# Patient Record
Sex: Female | Born: 1955 | Race: Black or African American | Hispanic: No | Marital: Married | Smoking: Current some day smoker
Health system: Southern US, Community
[De-identification: ages and names within clinical notes are randomized; demographics above are authoritative.]

## PROBLEM LIST (undated history)

## (undated) DIAGNOSIS — J449 Chronic obstructive pulmonary disease, unspecified: Secondary | ICD-10-CM

## (undated) DIAGNOSIS — E119 Type 2 diabetes mellitus without complications: Secondary | ICD-10-CM

## (undated) HISTORY — PX: TUBAL LIGATION: SHX77

## (undated) HISTORY — PX: KNEE SURGERY: SHX244

---

## 1999-10-14 ENCOUNTER — Other Ambulatory Visit: Admission: RE | Admit: 1999-10-14 | Discharge: 1999-10-14 | Payer: Self-pay | Admitting: Radiology

## 1999-11-08 ENCOUNTER — Other Ambulatory Visit: Admission: RE | Admit: 1999-11-08 | Discharge: 1999-11-08 | Payer: Self-pay | Admitting: Radiology

## 1999-11-08 ENCOUNTER — Encounter (INDEPENDENT_AMBULATORY_CARE_PROVIDER_SITE_OTHER): Payer: Self-pay

## 1999-11-11 ENCOUNTER — Other Ambulatory Visit: Admission: RE | Admit: 1999-11-11 | Discharge: 1999-11-11 | Payer: Self-pay | Admitting: Family Medicine

## 2001-10-08 ENCOUNTER — Other Ambulatory Visit: Admission: RE | Admit: 2001-10-08 | Discharge: 2001-10-20 | Payer: Self-pay | Admitting: Obstetrics and Gynecology

## 2001-10-20 ENCOUNTER — Emergency Department (HOSPITAL_COMMUNITY): Admission: EM | Admit: 2001-10-20 | Discharge: 2001-10-20 | Payer: Self-pay

## 2001-10-20 ENCOUNTER — Encounter: Payer: Self-pay | Admitting: Emergency Medicine

## 2003-03-17 ENCOUNTER — Ambulatory Visit (HOSPITAL_COMMUNITY): Admission: RE | Admit: 2003-03-17 | Discharge: 2003-03-17 | Payer: Self-pay | Admitting: Family Medicine

## 2003-06-02 ENCOUNTER — Ambulatory Visit (HOSPITAL_BASED_OUTPATIENT_CLINIC_OR_DEPARTMENT_OTHER): Admission: RE | Admit: 2003-06-02 | Discharge: 2003-06-02 | Payer: Self-pay | Admitting: Orthopedic Surgery

## 2004-07-24 ENCOUNTER — Encounter: Admission: RE | Admit: 2004-07-24 | Discharge: 2004-07-24 | Payer: Self-pay | Admitting: Orthopedic Surgery

## 2009-06-05 ENCOUNTER — Emergency Department (HOSPITAL_COMMUNITY): Admission: EM | Admit: 2009-06-05 | Discharge: 2009-06-05 | Payer: Self-pay | Admitting: Emergency Medicine

## 2010-10-22 NOTE — Op Note (Signed)
NAME:  [name], [name]                          ACCOUNT NO.:  [id number]   MEDICAL RECORD NO.:  [id number]                   PATIENT TYPE:  AMB   LOCATION:  DSC                                  FACILITY:  [facility]   PHYSICIAN:  Robert A. Thurston Hole, M.D.              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   OPERATION PERFORMED:  1. Left knee examination under anesthesia followed by arthroscopic partial     and lateral meniscectomies.  2. Left knee chondroplasty and partial synovectomy.   SURGEON:  Elana Alm. Thurston Hole, M.D.   ASSISTANT:  Julien Girt, P.A.   ANESTHESIA:  General.   OPERATIVE TIME:  30 minutes.   COMPLICATIONS:  None.   INDICATIONS FOR PROCEDURE:  Kimberly Daniel is a 55 year old woman who has had  six to eight months of increasing left knee pain with exam and MRI  documenting medial meniscal tear and chondromalacia who has failed  conservative care and is now to undergo arthroscopy.   DESCRIPTION OF PROCEDURE:  Kimberly Daniel was brought to the operating room on  June 02, 2003 and placed on the operating table in supine position.  After being placed under general anesthesia, her left knee was examined. She  had full range of motion.  The knee was stable to ligamentous exam with  normal patellar tracking.  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  She did,  however, receive Ancef 1 g IV preoperatively for prophylaxis.  The knee was  then sterilely injected with 0.25% Marcaine with epinephrine.  The left leg  was then prepped using sterile DuraPrep and then draped using sterile  technique.  Originally through an inferolateral portal, the  arthroscope with  a pump attached was placed and through an inferomedial portal, an  arthroscopic probe was placed.  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  Medial meniscus tearing posterior and medial horn 75% was  resected back to a stable rim.  Intercondylar notch inspected.  Anterior and  posterior cruciate ligaments were normal.  Lateral compartment was  inspected.  Mild grade 1 and 2 chondromalacia.  Lateral meniscus showed a  small tear posterolateral corner 15% which was resected back to a stable  rim.  Patellofemoral joint showed 25 to 30% grade 3 chondromalacia on the  patella and femoral groove and this was debrided. The patella tracked  normally.  Moderate synovitis in the medial and lateral gutters were  debrided.  Otherwise, they were free of pathology.  After this was  done, it  was felt that all pathology had been satisfactorily addressed.  Instruments  were removed.  Portals were closed with 3-0 nylon suture and injected with  0.25% Marcaine with epinephrine and 4 mg of morphine.  Sterile dressing was  applied.  The patient awakened and taken to recovery room in stable  condition.   FOLLOW UP:  Kimberly Daniel will be followed as an outpatient on Vicodin and  Naprosyn.  See her back in the office in a week for suture removal and  follow-up.                                               Robert A. Thurston Hole, M.D.    RAW/MEDQ  D:  06/02/2003  T:  06/02/2003  Job:  [id number]

## 2011-12-18 ENCOUNTER — Emergency Department (HOSPITAL_COMMUNITY)
Admission: EM | Admit: 2011-12-18 | Discharge: 2011-12-18 | Disposition: A | Discharge status: Home / Self Care | Payer: Managed Care, Other (non HMO) | Attending: Emergency Medicine | Admitting: Emergency Medicine

## 2011-12-18 ENCOUNTER — Encounter (HOSPITAL_COMMUNITY): Payer: Self-pay | Admitting: *Deleted

## 2011-12-18 DIAGNOSIS — K029 Dental caries, unspecified: Secondary | ICD-10-CM | POA: Insufficient documentation

## 2011-12-18 DIAGNOSIS — Z87891 Personal history of nicotine dependence: Secondary | ICD-10-CM | POA: Insufficient documentation

## 2011-12-18 DIAGNOSIS — K047 Periapical abscess without sinus: Secondary | ICD-10-CM

## 2011-12-18 MED ORDER — OXYCODONE-ACETAMINOPHEN 5-325 MG PO TABS: 1.0000 | ORAL_TABLET | Freq: Four times a day (QID) | ORAL | Status: AC | PRN

## 2011-12-18 MED ORDER — PENICILLIN V POTASSIUM 500 MG PO TABS: 500.0000 mg | ORAL_TABLET | Freq: Three times a day (TID) | ORAL | Status: AC

## 2011-12-18 NOTE — ED Provider Notes (Signed)
History     CSN: [id number]  Arrival date & time 12/18/11  1436   First MD Initiated Contact with Patient 12/18/11 1525      Chief Complaint  Patient presents with  . Dental Pain    (Consider location/radiation/quality/duration/timing/severity/associated sxs/prior treatment) HPI Comments: Patient here with gradual onset dental pain since Monday near her left lower second molar radiating to her jaw. Pain is worsening, "intense", and throbbing. Ibuprofen 400mg  provides some temporary relief along with use of oragel. Patient does not have a dentist and has not seen one in over a year. She has had problems with this tooth in the past and wanted dentist to pull it but claims he would not. Admits to cold sensitivity and cannot eat due to pain with chewing. Unsure if she has a fever due to menopausal hot flashes. Did not take her temperature.   Patient is a 56 y.o. female presenting with tooth pain. The history is provided by the patient.  Dental PainPrimary symptoms do not include shortness of breath.  Additional symptoms do not include: trouble swallowing.    History reviewed. No pertinent past medical history.  Past Surgical History  Procedure Date  . Tubal ligation     No family history on file.  History  Substance Use Topics  . Smoking status: Former Games developer  . Smokeless tobacco: Not on file  . Alcohol Use: No    OB History    Grav Para Term Preterm Abortions TAB SAB Ect Mult Living                  Review of Systems  HENT: Positive for dental problem. Negative for mouth sores, trouble swallowing and neck pain.   Respiratory: Negative for shortness of breath.   Cardiovascular: Negative for chest pain.  Skin: Negative for rash.  Neurological: Negative for numbness.    Allergies  Review of patient's allergies indicates no known allergies.  Home Medications   Current Outpatient Rx  Name Route Sig Dispense Refill  . IBUPROFEN 200 MG PO TABS Oral Take 400 mg by  mouth every 6 (six) hours as needed. FOR PAIN    . ADULT MULTIVITAMIN W/MINERALS CH Oral Take 1 tablet by mouth daily. 50 PLUS CENTRUM      BP 133/79  Pulse 77  Temp 98.4 F (36.9 C) (Oral)  Resp 17  SpO2 97%  Physical Exam  Nursing note and vitals reviewed. Constitutional: She appears well-developed and well-nourished. Distressed: uncomfortable.  HENT:  Head: Normocephalic and atraumatic.    Mouth/Throat: Oropharynx is clear and moist and mucous membranes are normal. Dental caries present. Dental abscesses: small tender abscess forming near lower left second molar, no flucctuance.  Eyes: Conjunctivae are normal.  Neck: Neck supple.  Cardiovascular: Normal rate, regular rhythm and normal heart sounds.   Pulmonary/Chest: Effort normal and breath sounds normal.  Skin: No rash noted. No erythema.    ED Course  Procedures (including critical care time)  Labs Reviewed - No data to display No results found.   No diagnosis found.  Dx- dental abscess, dental caries  MDM  56 yo female with dental pain since Monday. Forming abscess present, no flucctuance and not ready for drainage. Discharge on abx and pain controll, aware to call for f/u with dentist within 48 hours.         Trevor Mace, PA-C 12/18/11 1551

## 2011-12-18 NOTE — ED Notes (Signed)
Pt reports toothache starting Monday.  Pt reports pain to top left tooth where a filling had fallen out and bottom left tooth pain. Pt has used ibuprofen and oragel for pain without relief.  Pt denies having a dentist.

## 2011-12-22 NOTE — ED Provider Notes (Signed)
Medical screening examination/treatment/procedure(s) were performed by non-physician practitioner and as supervising physician I was immediately available for consultation/collaboration.  [place] Mckeough R. Tennelle Taflinger, MD 12/22/11 0028 

## 2014-06-05 ENCOUNTER — Other Ambulatory Visit: Payer: Self-pay | Admitting: Obstetrics and Gynecology

## 2014-06-05 DIAGNOSIS — R928 Other abnormal and inconclusive findings on diagnostic imaging of breast: Secondary | ICD-10-CM

## 2014-06-10 ENCOUNTER — Other Ambulatory Visit: Payer: Self-pay | Admitting: Obstetrics and Gynecology

## 2014-06-10 DIAGNOSIS — R928 Other abnormal and inconclusive findings on diagnostic imaging of breast: Secondary | ICD-10-CM

## 2014-06-18 ENCOUNTER — Other Ambulatory Visit (HOSPITAL_COMMUNITY): Payer: Self-pay | Admitting: Obstetrics and Gynecology

## 2014-06-18 DIAGNOSIS — R1900 Intra-abdominal and pelvic swelling, mass and lump, unspecified site: Secondary | ICD-10-CM

## 2014-06-19 ENCOUNTER — Ambulatory Visit
Admission: RE | Admit: 2014-06-19 | Discharge: 2014-06-19 | Disposition: A | Discharge status: Home / Self Care | Payer: Managed Care, Other (non HMO) | Source: Ambulatory Visit | Attending: Obstetrics and Gynecology | Admitting: Obstetrics and Gynecology

## 2014-06-19 DIAGNOSIS — R928 Other abnormal and inconclusive findings on diagnostic imaging of breast: Secondary | ICD-10-CM

## 2014-06-24 ENCOUNTER — Ambulatory Visit (HOSPITAL_COMMUNITY): Payer: Managed Care, Other (non HMO)

## 2015-09-10 ENCOUNTER — Emergency Department (HOSPITAL_COMMUNITY): Payer: Managed Care, Other (non HMO)

## 2015-09-10 ENCOUNTER — Emergency Department (HOSPITAL_COMMUNITY)
Admission: EM | Admit: 2015-09-10 | Discharge: 2015-09-10 | Disposition: A | Discharge status: Home / Self Care | Payer: Managed Care, Other (non HMO) | Attending: Emergency Medicine | Admitting: Emergency Medicine

## 2015-09-10 ENCOUNTER — Encounter (HOSPITAL_COMMUNITY): Payer: Self-pay | Admitting: Emergency Medicine

## 2015-09-10 DIAGNOSIS — J069 Acute upper respiratory infection, unspecified: Secondary | ICD-10-CM | POA: Diagnosis not present

## 2015-09-10 DIAGNOSIS — B349 Viral infection, unspecified: Secondary | ICD-10-CM | POA: Diagnosis not present

## 2015-09-10 DIAGNOSIS — Z79899 Other long term (current) drug therapy: Secondary | ICD-10-CM | POA: Diagnosis not present

## 2015-09-10 DIAGNOSIS — Z87891 Personal history of nicotine dependence: Secondary | ICD-10-CM | POA: Insufficient documentation

## 2015-09-10 DIAGNOSIS — R000 Tachycardia, unspecified: Secondary | ICD-10-CM | POA: Diagnosis not present

## 2015-09-10 DIAGNOSIS — R509 Fever, unspecified: Secondary | ICD-10-CM | POA: Diagnosis present

## 2015-09-10 LAB — CBC WITH DIFFERENTIAL/PLATELET
BASOS ABS: 0 10*3/uL (ref 0.0–0.1)
BASOS PCT: 0 %
EOS ABS: 0.1 10*3/uL (ref 0.0–0.7)
EOS PCT: 1 %
HCT: 46.1 % — ABNORMAL HIGH (ref 36.0–46.0)
Hemoglobin: 15.4 g/dL — ABNORMAL HIGH (ref 12.0–15.0)
Lymphocytes Relative: 18 %
Lymphs Abs: 1.7 10*3/uL (ref 0.7–4.0)
MCH: 29.3 pg (ref 26.0–34.0)
MCHC: 33.4 g/dL (ref 30.0–36.0)
MCV: 87.8 fL (ref 78.0–100.0)
MONO ABS: 0.7 10*3/uL (ref 0.1–1.0)
MONOS PCT: 7 %
NEUTROS ABS: 7.2 10*3/uL (ref 1.7–7.7)
Neutrophils Relative %: 74 %
PLATELETS: 306 10*3/uL (ref 150–400)
RBC: 5.25 MIL/uL — ABNORMAL HIGH (ref 3.87–5.11)
RDW: 14.5 % (ref 11.5–15.5)
WBC: 9.7 10*3/uL (ref 4.0–10.5)

## 2015-09-10 LAB — COMPREHENSIVE METABOLIC PANEL
ALBUMIN: 4 g/dL (ref 3.5–5.0)
ALK PHOS: 76 U/L (ref 38–126)
ALT: 17 U/L (ref 14–54)
ANION GAP: 9 (ref 5–15)
AST: 16 U/L (ref 15–41)
BILIRUBIN TOTAL: 0.4 mg/dL (ref 0.3–1.2)
BUN: 16 mg/dL (ref 6–20)
CALCIUM: 9.3 mg/dL (ref 8.9–10.3)
CO2: 23 mmol/L (ref 22–32)
CREATININE: 0.86 mg/dL (ref 0.44–1.00)
Chloride: 106 mmol/L (ref 101–111)
GFR calc Af Amer: >60 mL/min (ref >60)
GFR calc non Af Amer: >60 mL/min (ref >60)
GLUCOSE: 103 mg/dL — AB (ref 65–99)
Potassium: 4.1 mmol/L (ref 3.5–5.1)
Sodium: 138 mmol/L (ref 135–145)
TOTAL PROTEIN: 8.2 g/dL — AB (ref 6.5–8.1)

## 2015-09-10 LAB — URINALYSIS, ROUTINE W REFLEX MICROSCOPIC
BILIRUBIN URINE: NEGATIVE
GLUCOSE, UA: NEGATIVE mg/dL
Ketones, ur: NEGATIVE mg/dL
Leukocytes, UA: NEGATIVE
NITRITE: NEGATIVE
PH: 5.5 (ref 5.0–8.0)
Protein, ur: NEGATIVE mg/dL
SPECIFIC GRAVITY, URINE: 1.021 (ref 1.005–1.030)

## 2015-09-10 LAB — URINE MICROSCOPIC-ADD ON: WBC, UA: NONE SEEN WBC/hpf (ref 0–5)

## 2015-09-10 LAB — I-STAT CG4 LACTIC ACID, ED: Lactic Acid, Venous: 1.18 mmol/L (ref 0.5–2.0)

## 2015-09-10 LAB — RAPID STREP SCREEN (MED CTR MEBANE ONLY): Streptococcus, Group A Screen (Direct): NEGATIVE

## 2015-09-10 MED ORDER — ACETAMINOPHEN 325 MG PO TABS
650.0000 mg | ORAL_TABLET | Freq: Four times a day (QID) | ORAL | Status: AC | PRN
Start: 2015-09-10 — End: ?

## 2015-09-10 MED ORDER — BENZONATATE 100 MG PO CAPS: 100.0000 mg | ORAL_CAPSULE | Freq: Three times a day (TID) | ORAL | Status: AC

## 2015-09-10 MED ORDER — SODIUM CHLORIDE 0.9 % IV BOLUS (SEPSIS)
1000.0000 mL | Freq: Once | INTRAVENOUS | Status: AC
  Administered 2015-09-10: 1000 mL via INTRAVENOUS

## 2015-09-10 MED ORDER — ONDANSETRON 4 MG PO TBDP: 4.0000 mg | ORAL_TABLET | Freq: Three times a day (TID) | ORAL | Status: AC | PRN

## 2015-09-10 MED ORDER — ACETAMINOPHEN 325 MG PO TABS
650.0000 mg | ORAL_TABLET | Freq: Once | ORAL | Status: AC | PRN
  Administered 2015-09-10: 650 mg via ORAL
  Filled 2015-09-10: qty 2

## 2015-09-10 MED ORDER — FLUTICASONE PROPIONATE 50 MCG/ACT NA SUSP: 2.0000 | Freq: Every day | NASAL | Status: AC

## 2015-09-10 NOTE — ED Notes (Signed)
Pt from home with complaints of flu like symptoms x 1 month. She states she has diarrhea, fever, chills, congestion, and sore throat. Pt has not had a flu test nor a strep test

## 2015-09-10 NOTE — ED Provider Notes (Signed)
CSN: [id number]     Arrival date & time 09/10/15  1934 History   First MD Initiated Contact with Patient 09/10/15 2117     Chief Complaint  Patient presents with  . Fever  . Nasal Congestion  . Diarrhea  . Sore Throat    HPI  Kimberly Daniel is an 60 y.o. female with no significant PMH who presents to the ED for evaluation of cough, congestion, nausea, and diarrhea for the past five days. She states that originally she had flu-like symptoms about a month ago which resolved. However, her husband then got sick recently and now she has developed similar symptoms. She states she has had chills but is unsure if she has had a fever. She reports a cough productive of clear-white sputum. She endorses a sore throat and feels like her nose is congested. She endorses intermittent nausea and watery diarrhea but denies emesis. Denies abdominal pain or urinary symptoms. She denies chest pain or SOB. She states her symptoms were previously alleviated with Delsym and Alka Seltzer but feels those have not been helping as much over the past couple of days.  History reviewed. No pertinent past medical history. Past Surgical History  Procedure Laterality Date  . Tubal ligation     No family history on file. Social History  Substance Use Topics  . Smoking status: Former Research scientist (life sciences)  . Smokeless tobacco: None  . Alcohol Use: No   OB History    No data available     Review of Systems    Allergies  Review of patient's allergies indicates no known allergies.  Home Medications   Prior to Admission medications   Medication Sig Start Date End Date Taking? Authorizing Provider  dextromethorphan (DELSYM) 30 MG/5ML liquid Take 30 mg by mouth at bedtime as needed for cough.   Yes Historical Provider, MD  Multiple Vitamin (MULTIVITAMIN WITH MINERALS) TABS Take 1 tablet by mouth daily. 50 PLUS CENTRUM   Yes Historical Provider, MD  Phenyleph-Doxylamine-DM-APAP (ALKA SELTZER PLUS PO) Take 1 tablet by mouth daily as  needed (cold symptoms).   Yes Historical Provider, MD  ibuprofen (ADVIL,MOTRIN) 200 MG tablet Take 400 mg by mouth every 6 (six) hours as needed. FOR PAIN    Historical Provider, MD   BP 118/64 mmHg  Pulse 93  Temp(Src) 98.9 F (37.2 C) (Oral)  Resp 18  SpO2 93% Physical Exam  Constitutional: She is oriented to person, place, and time.  HENT:  Right Ear: External ear normal.  Left Ear: External ear normal.  Nose: Nose normal.  Mouth/Throat: Oropharynx is clear and moist. No oropharyngeal exudate.  Eyes: Conjunctivae and EOM are normal. Pupils are equal, round, and reactive to light.  Neck: Normal range of motion. Neck supple.  Cardiovascular: Regular rhythm, normal heart sounds and intact distal pulses.  Tachycardia present.   Pulmonary/Chest: Effort normal and breath sounds normal. No respiratory distress. She has no wheezes. She has no rales.  Abdominal: Soft. Bowel sounds are normal. She exhibits no distension. There is no tenderness. There is no rebound and no guarding.  Musculoskeletal: She exhibits no edema.  Neurological: She is alert and oriented to person, place, and time. No cranial nerve deficit.  Skin: Skin is warm and dry.  Psychiatric: She has a normal mood and affect.  Nursing note and vitals reviewed.  Filed Vitals:   09/10/15 1953 09/10/15 2202  BP: 146/89 118/64  Pulse: 120 93  Temp: 100.6 F (38.1 C) 98.9 F (37.2 C)  TempSrc: Oral Oral  Resp: 20 18  SpO2: 96% 93%     ED Course  Procedures (including critical care time) Labs Review Labs Reviewed  COMPREHENSIVE METABOLIC PANEL - Abnormal; Notable for the following:    Glucose, Bld 103 (*)    Total Protein 8.2 (*)    All other components within normal limits  CBC WITH DIFFERENTIAL/PLATELET - Abnormal; Notable for the following:    RBC 5.25 (*)    Hemoglobin 15.4 (*)    HCT 46.1 (*)    All other components within normal limits  URINALYSIS, ROUTINE W REFLEX MICROSCOPIC (NOT AT [facility]) - Abnormal;  Notable for the following:    Hgb urine dipstick MODERATE (*)    All other components within normal limits  URINE MICROSCOPIC-ADD ON - Abnormal; Notable for the following:    Squamous Epithelial / LPF 0-5 (*)    Bacteria, UA FEW (*)    All other components within normal limits  RAPID STREP SCREEN (NOT AT [facility])  CULTURE, GROUP A STREP ([place])  URINE CULTURE  CULTURE, BLOOD (ROUTINE X 2)  CULTURE, BLOOD (ROUTINE X 2)  I-STAT CG4 LACTIC ACID, ED    Imaging Review Dg Chest 2 View  09/10/2015  CLINICAL DATA:  Cough and congestion for 1 month. EXAM: CHEST  2 VIEW COMPARISON:  06/05/2009 FINDINGS: The cardiac silhouette, mediastinal and hilar contours are within normal limits and stable. There is tortuosity and calcification of the thoracic aorta. There are moderate bronchitic changes with peribronchial thickening, increased interstitial markings and streaky areas of atelectasis. Findings suggest bronchitis or interstitial pneumonitis. No focal airspace consolidation or pleural effusion. The bony thorax is intact. Stable degenerative changes involving the thoracic spine. IMPRESSION: Bronchitis or interstitial pneumonitis but no focal infiltrates. Electronically Signed   By: Marijo Sanes M.D.   On: 09/10/2015 21:48   I have personally reviewed and evaluated these images and lab results as part of my medical decision-making.   EKG Interpretation None      MDM   Final diagnoses:  Viral syndrome  URI (upper respiratory infection)    CXR shows bronchitis/pneumonitis but no infiltrate. Pt initially febrile to 100.80F with tachycardia of 120. Improved with tylenol and fluids. Her labs are otherwise unremarkable with no white count, no lactic acidosis. Rapid strep obtained in triage is negative. Pt is not hypoxic and has no increased WOB or tachypnea. I suspect viral syndrome. Will dc home with supportive meds and instructions to f/u with PCP. ER return precautions given.    Anne Ng,  PA-C 09/10/15 2255  Gareth Morgan, MD 09/14/15 [phone]

## 2015-09-10 NOTE — Discharge Instructions (Signed)
Take medications as prescribed. Return to the emergency room for worsening condition or new concerning symptoms. Follow up with your regular doctor. If you don't have a regular doctor use one of the numbers below to establish a primary care doctor. ° ° °Emergency Department Resource Guide °1) Find a Doctor and Pay Out of Pocket °Although you won't have to find out who is covered by your insurance plan, it is a good idea to ask around and get recommendations. You will then need to call the office and see if the doctor you have chosen will accept you as a new patient and what types of options they offer for patients who are self-pay. Some doctors offer discounts or will set up payment plans for their patients who do not have insurance, but you will need to ask so you aren't surprised when you get to your appointment. ° °2) Contact Your Local Health Department °Not all health departments have doctors that can see patients for sick visits, but many do, so it is worth a call to see if yours does. If you don't know where your local health department is, you can check in your phone book. The CDC also has a tool to help you locate your state's health department, and many state websites also have listings of all of their local health departments. ° °3) Find a Walk-in Clinic °If your illness is not likely to be very severe or complicated, you may want to try a walk in clinic. These are popping up all over the country in pharmacies, drugstores, and shopping centers. They're usually staffed by nurse practitioners or physician assistants that have been trained to treat common illnesses and complaints. They're usually fairly quick and inexpensive. However, if you have serious medical issues or chronic medical problems, these are probably not your best option. ° °No Primary Care Doctor: °- Call [facility] at  832-8000 - they can help you locate a primary care doctor that  accepts your insurance, provides certain services,  etc. °- Physician Referral Service- 1-800-533-3463 ° °Emergency Department Resource Guide °1) Find a Doctor and Pay Out of Pocket °Although you won't have to find out who is covered by your insurance plan, it is a good idea to ask around and get recommendations. You will then need to call the office and see if the doctor you have chosen will accept you as a new patient and what types of options they offer for patients who are self-pay. Some doctors offer discounts or will set up payment plans for their patients who do not have insurance, but you will need to ask so you aren't surprised when you get to your appointment. ° °2) Contact Your Local Health Department °Not all health departments have doctors that can see patients for sick visits, but many do, so it is worth a call to see if yours does. If you don't know where your local health department is, you can check in your phone book. The CDC also has a tool to help you locate your state's health department, and many state websites also have listings of all of their local health departments. ° °3) Find a Walk-in Clinic °If your illness is not likely to be very severe or complicated, you may want to try a walk in clinic. These are popping up all over the country in pharmacies, drugstores, and shopping centers. They're usually staffed by nurse practitioners or physician assistants that have been trained to treat common illnesses and complaints. They're usually fairly   quick and inexpensive. However, if you have serious medical issues or chronic medical problems, these are probably not your best option. ° °No Primary Care Doctor: °- Call [facility] at  832-8000 - they can help you locate a primary care doctor that  accepts your insurance, provides certain services, etc. °- Physician Referral Service- 1-800-533-3463 ° °Chronic Pain Problems: °Organization         Address  Phone   Notes  °[facility]  ([phone]) [phone] Patients need to be referred by  their primary care doctor.  ° °Medication Assistance: °Organization         Address  Phone   Notes  °[place] [facility] [address] °[place], [place] [address] ([phone]) 641-8030 --Must be a resident of [place] °-- Must have NO insurance coverage whatsoever (no Medicaid/ Medicare, etc.) °-- The pt. MUST have a primary care doctor that directs their care regularly and follows them in the community °  °[facility]  (866) 331-1348   °[employer]  (888) 892-1162   ° °Agencies that provide inexpensive medical care: °Organization         Address  Phone   Notes  °[facility]  ([phone]) [phone]   °[facility]    (336) 832-7272   °[facility] [address] °[place], [place] [address] (336) 832-4777   °[facility] [address], °[place] (336) 271-4999   °[facility]    (336) 373-0678   °[facility]    (336) 272-1050   °[facility] ° [address], [place] Phone:  (336) 832-4444, Fax:  (336) [phone] Hours of Operation:  9 am - 6 pm, M-F.  Also accepts Medicaid/Medicare and self-pay.  °[facility] ° [address], [place] Phone: (336) [phone], Fax: ([phone]) [phone]. Hours of Operation:  8:30 am - 5:30 pm, M-F.  Also accepts Medicaid and self-pay.  °[facility] [place] [address], [place] Phone: (336) [phone]   °[facility] [address], [place], [place] (336)723-1848, Ext. 123 Mondays & Thursdays: 7-9 AM.  First 15 patients are seen on a first come, first serve basis. °  ° °Medicaid-accepting [place] Providers: ° °Organization         Address  Phone   Notes  °[facility] [address], [place] ([phone]) [phone] Also accepts self-pay patients.  °[facility] [address], [place] ° ([phone]) [phone]   °[facility]  [address], [place] ([phone]) [phone]   °[facility] [address] [place] [address], [place] (336) [phone]   °[facility] [address], [place]  ° ([phone]) [phone] Only accepts [place] Access Medicaid patients after they have their name applied to their card.  ° °Self-Pay (no insurance) in [place]: ° °Organization         Address  Phone   Notes  °Sickle Cell Patients, [facility] [address], [place] ([phone]) [phone]   °[facility] [address], [place] ([phone]) [phone]   °[facility] ° [address], [place] ([phone]) [phone]   °[facility]/Dr. Osei-Bonsu ° [address] [place] [address], [place] or [address], [place] [phone]) [phone] Phone number   for both [place] and [place] locations is the same.  °[facility] [address], [place] ([phone]) [phone]   °[facility] [address] [place] [address], [place] or [address] ([phone]) [phone] °(336) [phone]   °[facility] [address], [place] (336) [phone], phone; ([phone]) [phone], fax Sees patients 1st and 3rd Saturday of every month.  Must not qualify for public or private insurance (i.e. Medicaid, Medicare, [place] Health Choice, Veterans' Benefits) • Household income should be no more than 200% of the poverty level •The clinic cannot treat you if you are pregnant or think you are pregnant • Sexually transmitted diseases are not treated at the clinic.  ° ° ° °

## 2015-09-10 NOTE — ED Notes (Signed)
Patient transported to X-ray 

## 2015-09-12 LAB — URINE CULTURE

## 2015-09-13 LAB — CULTURE, GROUP A STREP (THRC)

## 2015-09-16 LAB — CULTURE, BLOOD (ROUTINE X 2)
CULTURE: NO GROWTH
Culture: NO GROWTH

## 2016-01-15 ENCOUNTER — Ambulatory Visit: Payer: Managed Care, Other (non HMO) | Admitting: Podiatry

## 2018-03-09 ENCOUNTER — Other Ambulatory Visit: Payer: Self-pay | Admitting: Family Medicine

## 2018-03-09 ENCOUNTER — Other Ambulatory Visit (HOSPITAL_COMMUNITY)
Admission: RE | Admit: 2018-03-09 | Discharge: 2018-03-09 | Disposition: A | Discharge status: Home / Self Care | Payer: Managed Care, Other (non HMO) | Source: Ambulatory Visit | Attending: Family Medicine | Admitting: Family Medicine

## 2018-03-09 DIAGNOSIS — Z124 Encounter for screening for malignant neoplasm of cervix: Secondary | ICD-10-CM | POA: Diagnosis present

## 2018-03-13 ENCOUNTER — Other Ambulatory Visit: Payer: Self-pay | Admitting: Family Medicine

## 2018-03-13 DIAGNOSIS — E2839 Other primary ovarian failure: Secondary | ICD-10-CM

## 2018-03-13 DIAGNOSIS — Z1231 Encounter for screening mammogram for malignant neoplasm of breast: Secondary | ICD-10-CM

## 2018-03-13 LAB — CYTOLOGY - PAP
ADEQUACY: ABSENT
DIAGNOSIS: NEGATIVE
HPV (WINDOPATH): DETECTED — AB

## 2018-03-27 ENCOUNTER — Ambulatory Visit: Payer: Managed Care, Other (non HMO) | Admitting: Sports Medicine

## 2018-05-18 ENCOUNTER — Other Ambulatory Visit: Payer: Managed Care, Other (non HMO)

## 2018-05-18 ENCOUNTER — Ambulatory Visit: Payer: Managed Care, Other (non HMO)

## 2018-10-18 ENCOUNTER — Encounter (HOSPITAL_COMMUNITY): Payer: Self-pay

## 2018-10-18 ENCOUNTER — Other Ambulatory Visit: Payer: Self-pay

## 2018-10-18 ENCOUNTER — Emergency Department (HOSPITAL_COMMUNITY)
Admission: EM | Admit: 2018-10-18 | Discharge: 2018-10-18 | Disposition: A | Discharge status: Home / Self Care | Payer: 59 | Attending: Emergency Medicine | Admitting: Emergency Medicine

## 2018-10-18 ENCOUNTER — Emergency Department (HOSPITAL_COMMUNITY): Payer: 59

## 2018-10-18 DIAGNOSIS — Z79899 Other long term (current) drug therapy: Secondary | ICD-10-CM | POA: Insufficient documentation

## 2018-10-18 DIAGNOSIS — R932 Abnormal findings on diagnostic imaging of liver and biliary tract: Secondary | ICD-10-CM | POA: Diagnosis not present

## 2018-10-18 DIAGNOSIS — R109 Unspecified abdominal pain: Secondary | ICD-10-CM | POA: Diagnosis present

## 2018-10-18 DIAGNOSIS — R1011 Right upper quadrant pain: Secondary | ICD-10-CM | POA: Diagnosis not present

## 2018-10-18 DIAGNOSIS — Z87891 Personal history of nicotine dependence: Secondary | ICD-10-CM | POA: Diagnosis not present

## 2018-10-18 LAB — CBC WITH DIFFERENTIAL/PLATELET
Abs Immature Granulocytes: 0.02 10*3/uL (ref 0.00–0.07)
Basophils Absolute: 0 10*3/uL (ref 0.0–0.1)
Basophils Relative: 0 %
Eosinophils Absolute: 0.2 10*3/uL (ref 0.0–0.5)
Eosinophils Relative: 3 %
HCT: 45 % (ref 36.0–46.0)
Hemoglobin: 14.5 g/dL (ref 12.0–15.0)
Immature Granulocytes: 0 %
Lymphocytes Relative: 25 %
Lymphs Abs: 1.5 10*3/uL (ref 0.7–4.0)
MCH: 29.5 pg (ref 26.0–34.0)
MCHC: 32.2 g/dL (ref 30.0–36.0)
MCV: 91.5 fL (ref 80.0–100.0)
Monocytes Absolute: 0.6 10*3/uL (ref 0.1–1.0)
Monocytes Relative: 10 %
Neutro Abs: 3.6 10*3/uL (ref 1.7–7.7)
Neutrophils Relative %: 62 %
Platelets: 255 10*3/uL (ref 150–400)
RBC: 4.92 MIL/uL (ref 3.87–5.11)
RDW: 13.7 % (ref 11.5–15.5)
WBC: 5.9 10*3/uL (ref 4.0–10.5)
nRBC: 0 % (ref 0.0–0.2)

## 2018-10-18 LAB — URINALYSIS, ROUTINE W REFLEX MICROSCOPIC
Bilirubin Urine: NEGATIVE
Glucose, UA: NEGATIVE mg/dL
Hgb urine dipstick: NEGATIVE
Ketones, ur: NEGATIVE mg/dL
Leukocytes,Ua: NEGATIVE
Nitrite: NEGATIVE
Protein, ur: NEGATIVE mg/dL
Specific Gravity, Urine: 1.015 (ref 1.005–1.030)
pH: 6 (ref 5.0–8.0)

## 2018-10-18 LAB — COMPREHENSIVE METABOLIC PANEL
ALT: 13 U/L (ref 0–44)
AST: 14 U/L — ABNORMAL LOW (ref 15–41)
Albumin: 3.5 g/dL (ref 3.5–5.0)
Alkaline Phosphatase: 67 U/L (ref 38–126)
Anion gap: 7 (ref 5–15)
BUN: 15 mg/dL (ref 8–23)
CO2: 27 mmol/L (ref 22–32)
Calcium: 8.8 mg/dL — ABNORMAL LOW (ref 8.9–10.3)
Chloride: 106 mmol/L (ref 98–111)
Creatinine, Ser: 0.68 mg/dL (ref 0.44–1.00)
GFR calc Af Amer: >60 mL/min (ref >60)
GFR calc non Af Amer: >60 mL/min (ref >60)
Glucose, Bld: 88 mg/dL (ref 70–99)
Potassium: 4 mmol/L (ref 3.5–5.1)
Sodium: 140 mmol/L (ref 135–145)
Total Bilirubin: 0.5 mg/dL (ref 0.3–1.2)
Total Protein: 7.1 g/dL (ref 6.5–8.1)

## 2018-10-18 LAB — LIPASE, BLOOD: Lipase: 28 U/L (ref 11–51)

## 2018-10-18 NOTE — ED Triage Notes (Signed)
Pt to ED reports right side abdominal pain that started yesterday. Rates pain 5/10. No n/v/d.

## 2018-10-18 NOTE — ED Provider Notes (Signed)
[facility] DEPT Provider Note   CSN: [id number] Arrival date & time: 10/18/18  1532    History   Chief Complaint Chief Complaint  Patient presents with  . Abdominal Pain    HPI Kimberly Daniel is a 63 y.o. female.     The history is provided by the patient. No language interpreter was used.  Abdominal Pain     63 year old female presenting for evaluation of abdominal pain.  Patient reports since yesterday she has had waxing waning pain to her upper abdomen.  Pain described as a achy sensation, nonradiating, improved with taking Advil, nothing makes it worse.  Pain has been episodic.  Currently rates her pain is 5 out of 10.  She does not complain of any associated fever or chills, no new productive cough, shortness of breath, pain, nausea vomiting diarrhea constipation dysuria or hematuria.  Denies pain with eating.  She does use tobacco but denies alcohol abuse.  She has prior tubal ligation.  She last used Advil earlier today but it did not provide adequate relief of pain.  She denies any recent sick contact with COVID-19.  History reviewed. No pertinent past medical history.  There are no active problems to display for this patient.   Past Surgical History:  Procedure Laterality Date  . TUBAL LIGATION       OB History   No obstetric history on file.      Home Medications    Prior to Admission medications   Medication Sig Start Date End Date Taking? Authorizing Provider  acetaminophen (TYLENOL) 325 MG tablet Take 2 tablets (650 mg total) by mouth every 6 (six) hours as needed. 09/10/15   Sam, Olivia Canter, PA-C  benzonatate (TESSALON) 100 MG capsule Take 1 capsule (100 mg total) by mouth every 8 (eight) hours. 09/10/15   Sam, Olivia Canter, PA-C  dextromethorphan (DELSYM) 30 MG/5ML liquid Take 30 mg by mouth at bedtime as needed for cough.    [provider]  fluticasone (FLONASE) 50 MCG/ACT nasal spray Place 2 sprays into both  nostrils daily. 09/10/15   Sam, Olivia Canter, PA-C  ibuprofen (ADVIL,MOTRIN) 200 MG tablet Take 400 mg by mouth every 6 (six) hours as needed. FOR PAIN    [provider]  Multiple Vitamin (MULTIVITAMIN WITH MINERALS) TABS Take 1 tablet by mouth daily. 50 PLUS CENTRUM    [provider]  ondansetron (ZOFRAN ODT) 4 MG disintegrating tablet Take 1 tablet (4 mg total) by mouth every 8 (eight) hours as needed for nausea or vomiting. 09/10/15   Sam, Olivia Canter, PA-C  Phenyleph-Doxylamine-DM-APAP (ALKA SELTZER PLUS PO) Take 1 tablet by mouth daily as needed (cold symptoms).    [provider]    Family History History reviewed. No pertinent family history.  Social History Social History   Tobacco Use  . Smoking status: Former Smoker  Substance Use Topics  . Alcohol use: No  . Drug use: No     Allergies   Patient has no known allergies.   Review of Systems Review of Systems  Gastrointestinal: Positive for abdominal pain.  All other systems reviewed and are negative.    Physical Exam Updated Vital Signs BP (!) 148/87 (BP Location: Right Arm)   Pulse 90   Temp 97.8 F (36.6 C) (Oral)   Resp 16   Ht 5\' 5"  (1.651 m)   Wt 104.3 kg   SpO2 95%   BMI 38.27 kg/m   Physical Exam Vitals signs  and nursing note reviewed.  Constitutional:      General: She is not in acute distress.    Appearance: She is well-developed.  HENT:     Head: Atraumatic.  Eyes:     Conjunctiva/sclera: Conjunctivae normal.  Neck:     Musculoskeletal: Neck supple.  Cardiovascular:     Rate and Rhythm: Normal rate and regular rhythm.     Heart sounds: Normal heart sounds.  Pulmonary:     Effort: Pulmonary effort is normal.     Breath sounds: Wheezing (Very faint expiratory wheezes) present.  Abdominal:     General: Abdomen is flat.     Palpations: Abdomen is soft.     Tenderness: There is abdominal tenderness in the right upper quadrant. There is no right CVA tenderness or left CVA  tenderness. Negative signs include Murphy's sign and McBurney's sign.  Skin:    Findings: No rash.  Neurological:     Mental Status: She is alert.      ED Treatments / Results  Labs (all labs ordered are listed, but only abnormal results are displayed) Labs Reviewed  COMPREHENSIVE METABOLIC PANEL - Abnormal; Notable for the following components:      Result Value   Calcium 8.8 (*)    AST 14 (*)    All other components within normal limits  URINALYSIS, ROUTINE W REFLEX MICROSCOPIC - Abnormal; Notable for the following components:   APPearance HAZY (*)    All other components within normal limits  CBC WITH DIFFERENTIAL/PLATELET  LIPASE, BLOOD    EKG None  Radiology US Abdomen Limited  Result Date: 10/18/2018 CLINICAL DATA:  Right upper quadrant abdominal pain. EXAM: ULTRASOUND ABDOMEN LIMITED RIGHT UPPER QUADRANT COMPARISON:  None. FINDINGS: Gallbladder: No gallstones or wall thickening visualized. No sonographic Murphy sign noted by sonographer. Common bile duct: Diameter: 0.6 cm tapering to 0.3 cm distally. Liver: There is a 1.6 x 1.5 x 1 cm hypoechoic lesion in the left hepatic lobe. This does not appear entirely anechoic on today's exam. Within normal limits in parenchymal echogenicity. Portal vein is patent on color Doppler imaging with normal direction of blood flow towards the liver. IMPRESSION: 1. No acute sonographic abnormality detected.  No gallstones. 2. Incidentally noted 1.6 cm hypoechoic lesion in the left hepatic lobe. While this is favored to represent a benign cyst, it is not entirely anechoic on this exam. A three-month follow-up ultrasound is recommended to confirm stability of this finding. Electronically Signed   By: Constance Holster M.D.   On: 10/18/2018 17:13    Procedures Procedures (including critical care time)  Medications Ordered in ED Medications - No data to display   Initial Impression / Assessment and Plan / ED Course  I have reviewed the  triage vital signs and the nursing notes.  Pertinent labs & imaging results that were available during my care of the patient were reviewed by me and considered in my medical decision making (see chart for details).        BP (!) 148/87 (BP Location: Right Arm)   Pulse 90   Temp 97.8 F (36.6 C) (Oral)   Resp 16   Ht 5\' 5"  (1.651 m)   Wt 104.3 kg   SpO2 95%   BMI 38.27 kg/m    Final Clinical Impressions(s) / ED Diagnoses   Final diagnoses:  RUQ pain    ED Discharge Orders    None     4:13 PM Patient here with pain to her right upper  quadrant since yesterday.  She does have some mild tenderness on palpation of right upper abdomen without guarding or rebound tenderness.  She otherwise well-appearing.  Will obtain related abdominal ultrasound for further evaluation of her gallbladder.  Work-up initiated.  Pain medication offered patient declined.  Patient also does not have any symptoms concerning for COVID-19.  No chest pain shortness of breath productive cough or fever.  6:04 PM Labs are reassuring, normal WBC, normal H&H, electrolyte panels are reassuring, kidney function is normal, lipase is normal, urine shows no signs of urinary tract infection, limited abdominal ultrasound without any acute acute finding.  There is a 1.5 x 1.6 lesion in the liver that is likely a benign cyst however it is recommended for patient to have a repeat ultrasound in 3 months for reassessment.  I did discuss this with patient and she is in agreement.  At this time no acute emergent medical condition identified.  Patient is stable for discharge to follow-up with PCP.   Domenic Moras, PA-C 10/18/18 Evalee Jefferson    Charlesetta Shanks, MD 10/31/18 1750

## 2018-10-18 NOTE — Discharge Instructions (Addendum)
You have been evaluated for your abdominal pain.  No acute life threatening condition identified during today's visit.  There's a 1.6x1.5cm lesion in your liver that is likely a benign cyst.  However, please have a repeat ultrasound in 3 months to ensure no significant changes. Follow up with your doctor for further care.

## 2019-01-29 ENCOUNTER — Other Ambulatory Visit: Payer: Self-pay | Admitting: Family Medicine

## 2019-01-29 DIAGNOSIS — K7689 Other specified diseases of liver: Secondary | ICD-10-CM

## 2019-02-12 ENCOUNTER — Other Ambulatory Visit: Payer: 59

## 2019-03-23 ENCOUNTER — Other Ambulatory Visit: Payer: Self-pay

## 2019-03-23 DIAGNOSIS — Z20822 Contact with and (suspected) exposure to covid-19: Secondary | ICD-10-CM

## 2019-03-25 LAB — NOVEL CORONAVIRUS, NAA: SARS-CoV-2, NAA: NOT DETECTED

## 2019-03-26 ENCOUNTER — Telehealth: Payer: Self-pay | Admitting: General Practice

## 2019-03-26 ENCOUNTER — Other Ambulatory Visit: Payer: Self-pay | Admitting: Family Medicine

## 2019-03-26 ENCOUNTER — Other Ambulatory Visit (HOSPITAL_COMMUNITY)
Admission: RE | Admit: 2019-03-26 | Discharge: 2019-03-26 | Disposition: A | Discharge status: Home / Self Care | Payer: 59 | Source: Ambulatory Visit | Attending: Family Medicine | Admitting: Family Medicine

## 2019-03-26 DIAGNOSIS — Z124 Encounter for screening for malignant neoplasm of cervix: Secondary | ICD-10-CM | POA: Diagnosis not present

## 2019-03-26 NOTE — Telephone Encounter (Signed)
Negative COVID results given. Patient results "NOT Detected." Caller expressed understanding. ° °

## 2019-03-28 LAB — CYTOLOGY - PAP
Adequacy: ABSENT
Comment: NEGATIVE
Diagnosis: NEGATIVE
High risk HPV: NEGATIVE

## 2019-04-03 LAB — MOLECULAR ANCILLARY ONLY???: Comment: NEGATIVE

## 2019-04-03 LAB — MOLECULAR ANCILLARY ONLY
Candida Glabrata: NEGATIVE
Candida Vaginitis: NEGATIVE
Comment: NEGATIVE

## 2019-04-14 ENCOUNTER — Other Ambulatory Visit: Payer: Self-pay

## 2019-04-14 ENCOUNTER — Emergency Department (HOSPITAL_COMMUNITY)
Admission: EM | Admit: 2019-04-14 | Discharge: 2019-04-14 | Disposition: A | Discharge status: Home / Self Care | Payer: 59 | Attending: Emergency Medicine | Admitting: Emergency Medicine

## 2019-04-14 ENCOUNTER — Encounter (HOSPITAL_COMMUNITY): Payer: Self-pay | Admitting: *Deleted

## 2019-04-14 DIAGNOSIS — J449 Chronic obstructive pulmonary disease, unspecified: Secondary | ICD-10-CM | POA: Diagnosis not present

## 2019-04-14 DIAGNOSIS — F17210 Nicotine dependence, cigarettes, uncomplicated: Secondary | ICD-10-CM | POA: Diagnosis not present

## 2019-04-14 DIAGNOSIS — Z79899 Other long term (current) drug therapy: Secondary | ICD-10-CM | POA: Diagnosis not present

## 2019-04-14 DIAGNOSIS — F17200 Nicotine dependence, unspecified, uncomplicated: Secondary | ICD-10-CM | POA: Insufficient documentation

## 2019-04-14 DIAGNOSIS — L304 Erythema intertrigo: Secondary | ICD-10-CM | POA: Insufficient documentation

## 2019-04-14 DIAGNOSIS — R21 Rash and other nonspecific skin eruption: Secondary | ICD-10-CM | POA: Insufficient documentation

## 2019-04-14 DIAGNOSIS — L299 Pruritus, unspecified: Secondary | ICD-10-CM | POA: Diagnosis present

## 2019-04-14 HISTORY — DX: Chronic obstructive pulmonary disease, unspecified: J44.9

## 2019-04-14 HISTORY — DX: Type 2 diabetes mellitus without complications: E11.9

## 2019-04-14 MED ORDER — PRAMOXINE HCL 1 % EX CREA: 1.0000 [in_us] | TOPICAL_CREAM | Freq: Four times a day (QID) | CUTANEOUS | 0 refills | Status: AC

## 2019-04-14 MED ORDER — FLUCONAZOLE 150 MG PO TABS
150.0000 mg | ORAL_TABLET | Freq: Once | ORAL | Status: AC
  Administered 2019-04-14: 150 mg via ORAL
  Filled 2019-04-14: qty 1

## 2019-04-14 MED ORDER — NYSTATIN 100000 UNIT/GM EX CREA
TOPICAL_CREAM | Freq: Two times a day (BID) | CUTANEOUS | Status: DC
  Filled 2019-04-14: qty 15

## 2019-04-14 MED ORDER — DIPHENHYDRAMINE HCL 25 MG PO CAPS
50.0000 mg | ORAL_CAPSULE | Freq: Once | ORAL | Status: AC
  Administered 2019-04-14: 50 mg via ORAL
  Filled 2019-04-14: qty 2

## 2019-04-14 MED ORDER — CLOTRIMAZOLE 1 % EX CREA: TOPICAL_CREAM | CUTANEOUS | 0 refills | Status: DC

## 2019-04-14 MED ORDER — PRAMOXINE HCL 1 % EX CREA: 1.0000 [in_us] | TOPICAL_CREAM | Freq: Four times a day (QID) | CUTANEOUS | 0 refills | Status: DC

## 2019-04-14 MED ORDER — CLOTRIMAZOLE 1 % EX CREA: TOPICAL_CREAM | CUTANEOUS | 0 refills | Status: AC

## 2019-04-14 MED ORDER — CLOTRIMAZOLE 1 % EX CREA
TOPICAL_CREAM | Freq: Two times a day (BID) | CUTANEOUS | Status: DC
  Administered 2019-04-14: 21:00:00 via TOPICAL
  Filled 2019-04-14: qty 15

## 2019-04-14 NOTE — ED Triage Notes (Signed)
Pt states she has been itching from waist down for a long time, has been seen multiple times and given meds which do not help

## 2019-04-14 NOTE — ED Notes (Signed)
Messaged pharmacy for lotrimin cream

## 2019-04-14 NOTE — ED Provider Notes (Signed)
[facility] DEPT Provider Note   CSN: [id number] Arrival date & time: 04/14/19  1828     History   Chief Complaint Chief Complaint  Patient presents with  . Pruritis    HPI Kimberly Daniel is a 63 y.o. female.     Pt presents to the ED today with itching.  Pt said sx have been going on for a year.  She has tried several different meds without improvement of sx.  She came in today because she can't sleep due to the itching.  Pt said her itching is from the waist down.  However, when I ask her to show me, she points to under her breasts and in her groin creases.  She has never tried an antifungal.       Past Medical History:  Diagnosis Date  . COPD (chronic obstructive pulmonary disease) ([place])   . Diabetes mellitus without complication ([place])     There are no active problems to display for this patient.   Past Surgical History:  Procedure Laterality Date  . KNEE SURGERY    . TUBAL LIGATION       OB History   No obstetric history on file.      Home Medications    Prior to Admission medications   Medication Sig Start Date End Date Taking? Authorizing Provider  acetaminophen (TYLENOL) 325 MG tablet Take 2 tablets (650 mg total) by mouth every 6 (six) hours as needed. 09/10/15   Sam, Olivia Canter, PA-C  benzonatate (TESSALON) 100 MG capsule Take 1 capsule (100 mg total) by mouth every 8 (eight) hours. 09/10/15   Sam, Olivia Canter, PA-C  clotrimazole (LOTRIMIN) 1 % cream Apply to affected area 2 times daily 04/14/19   Isla Pence, MD  dextromethorphan (DELSYM) 30 MG/5ML liquid Take 30 mg by mouth at bedtime as needed for cough.    [provider]  fluticasone (FLONASE) 50 MCG/ACT nasal spray Place 2 sprays into both nostrils daily. 09/10/15   Sam, Olivia Canter, PA-C  ibuprofen (ADVIL,MOTRIN) 200 MG tablet Take 400 mg by mouth every 6 (six) hours as needed. FOR PAIN    [provider]  Multiple Vitamin (MULTIVITAMIN WITH MINERALS)  TABS Take 1 tablet by mouth daily. 50 PLUS CENTRUM    [provider]  ondansetron (ZOFRAN ODT) 4 MG disintegrating tablet Take 1 tablet (4 mg total) by mouth every 8 (eight) hours as needed for nausea or vomiting. 09/10/15   Sam, Olivia Canter, PA-C  Phenyleph-Doxylamine-DM-APAP (ALKA SELTZER PLUS PO) Take 1 tablet by mouth daily as needed (cold symptoms).    [provider]  Pramoxine HCl 1 % CREA Apply 1 inch topically 4 (four) times daily. 04/14/19   Isla Pence, MD    Family History No family history on file.  Social History Social History   Tobacco Use  . Smoking status: Current Some Day Smoker  . Smokeless tobacco: Never Used  Substance Use Topics  . Alcohol use: No  . Drug use: No     Allergies   Patient has no known allergies.   Review of Systems Review of Systems  Skin: Positive for rash.       itching  All other systems reviewed and are negative.    Physical Exam Updated Vital Signs BP (!) 141/81 (BP Location: Right Arm)   Pulse 86   Temp 98.4 F (36.9 C) (Oral)   Resp 16   Ht 5\' 3"  (1.6 m)   Wt  105.2 kg   SpO2 94%   BMI 41.10 kg/m   Physical Exam Vitals signs and nursing note reviewed.  Constitutional:      Appearance: Normal appearance. She is obese.  HENT:     Head: Normocephalic and atraumatic.     Right Ear: External ear normal.     Left Ear: External ear normal.     Nose: Nose normal.     Mouth/Throat:     Mouth: Mucous membranes are moist.     Pharynx: Oropharynx is clear.  Eyes:     Extraocular Movements: Extraocular movements intact.     Conjunctiva/sclera: Conjunctivae normal.     Pupils: Pupils are equal, round, and reactive to light.  Neck:     Musculoskeletal: Normal range of motion and neck supple.  Cardiovascular:     Rate and Rhythm: Normal rate and regular rhythm.     Pulses: Normal pulses.     Heart sounds: Normal heart sounds.  Pulmonary:     Effort: Pulmonary effort is normal.     Breath sounds: Normal  breath sounds.  Abdominal:     General: Abdomen is flat. Bowel sounds are normal.     Palpations: Abdomen is soft.  Skin:    Capillary Refill: Capillary refill takes less than 2 seconds.     Comments: Intertrigo in both groin creases and under breasts.  Neurological:     Mental Status: She is alert.      ED Treatments / Results  Labs (all labs ordered are listed, but only abnormal results are displayed) Labs Reviewed - No data to display  EKG None  Radiology No results found.  Procedures Procedures (including critical care time)  Medications Ordered in ED Medications  fluconazole (DIFLUCAN) tablet 150 mg (has no administration in time range)  diphenhydrAMINE (BENADRYL) capsule 50 mg (has no administration in time range)  clotrimazole (LOTRIMIN) 1 % cream (has no administration in time range)     Initial Impression / Assessment and Plan / ED Course  I have reviewed the triage vital signs and the nursing notes.  Pertinent labs & imaging results that were available during my care of the patient were reviewed by me and considered in my medical decision making (see chart for details).       Sx c/w intertrigo.  She will be d/c home with clotrimazole and pramoxine and told to f/u with derm.  She knows to return if worse.  Final Clinical Impressions(s) / ED Diagnoses   Final diagnoses:  Intertrigo    ED Discharge Orders         Ordered    clotrimazole (LOTRIMIN) 1 % cream     04/14/19 2039    Pramoxine HCl 1 % CREA  4 times daily     04/14/19 2039           Isla Pence, MD 04/14/19 2041

## 2019-05-15 ENCOUNTER — Other Ambulatory Visit: Payer: Self-pay | Admitting: Family Medicine

## 2019-05-15 DIAGNOSIS — K7689 Other specified diseases of liver: Secondary | ICD-10-CM

## 2019-05-15 DIAGNOSIS — Z1231 Encounter for screening mammogram for malignant neoplasm of breast: Secondary | ICD-10-CM

## 2019-05-15 DIAGNOSIS — E2839 Other primary ovarian failure: Secondary | ICD-10-CM

## 2019-06-17 ENCOUNTER — Ambulatory Visit: Payer: Self-pay | Attending: Internal Medicine

## 2019-06-17 DIAGNOSIS — Z20822 Contact with and (suspected) exposure to covid-19: Secondary | ICD-10-CM | POA: Insufficient documentation

## 2019-06-18 LAB — SPECIMEN STATUS REPORT

## 2019-06-18 LAB — NOVEL CORONAVIRUS, NAA: SARS-CoV-2, NAA: NOT DETECTED

## 2019-06-21 ENCOUNTER — Telehealth: Payer: Self-pay | Admitting: General Practice

## 2019-06-21 NOTE — Telephone Encounter (Signed)
Gave patient negative covid test results, Patient understood

## 2019-08-14 ENCOUNTER — Ambulatory Visit: Payer: Self-pay | Attending: Internal Medicine

## 2019-08-14 DIAGNOSIS — Z20822 Contact with and (suspected) exposure to covid-19: Secondary | ICD-10-CM | POA: Insufficient documentation

## 2019-08-15 LAB — NOVEL CORONAVIRUS, NAA: SARS-CoV-2, NAA: NOT DETECTED

## 2019-08-17 ENCOUNTER — Telehealth: Payer: Self-pay

## 2019-08-17 NOTE — Telephone Encounter (Signed)

## 2019-09-25 ENCOUNTER — Other Ambulatory Visit (HOSPITAL_COMMUNITY): Payer: Self-pay | Admitting: Family Medicine

## 2019-09-25 DIAGNOSIS — R270 Ataxia, unspecified: Secondary | ICD-10-CM

## 2019-09-27 ENCOUNTER — Ambulatory Visit (HOSPITAL_COMMUNITY): Payer: Self-pay

## 2019-09-30 ENCOUNTER — Encounter (HOSPITAL_COMMUNITY): Payer: Self-pay

## 2019-09-30 ENCOUNTER — Ambulatory Visit (HOSPITAL_COMMUNITY): Admission: RE | Admit: 2019-09-30 | Payer: Self-pay | Source: Ambulatory Visit

## 2019-10-01 ENCOUNTER — Inpatient Hospital Stay (HOSPITAL_COMMUNITY)
Admission: EM | Admit: 2019-10-01 | Discharge: 2019-11-05 | DRG: 025 | Disposition: E | Payer: Self-pay | Attending: Neurosurgery | Admitting: Neurosurgery

## 2019-10-01 ENCOUNTER — Other Ambulatory Visit: Payer: Self-pay

## 2019-10-01 ENCOUNTER — Encounter (HOSPITAL_COMMUNITY): Payer: Self-pay | Admitting: *Deleted

## 2019-10-01 ENCOUNTER — Emergency Department (HOSPITAL_COMMUNITY): Payer: Self-pay

## 2019-10-01 DIAGNOSIS — R600 Localized edema: Secondary | ICD-10-CM | POA: Diagnosis not present

## 2019-10-01 DIAGNOSIS — I6200 Nontraumatic subdural hemorrhage, unspecified: Secondary | ICD-10-CM | POA: Diagnosis not present

## 2019-10-01 DIAGNOSIS — I6389 Other cerebral infarction: Secondary | ICD-10-CM | POA: Diagnosis not present

## 2019-10-01 DIAGNOSIS — Z789 Other specified health status: Secondary | ICD-10-CM

## 2019-10-01 DIAGNOSIS — R680 Hypothermia, not associated with low environmental temperature: Secondary | ICD-10-CM | POA: Diagnosis not present

## 2019-10-01 DIAGNOSIS — D332 Benign neoplasm of brain, unspecified: Secondary | ICD-10-CM

## 2019-10-01 DIAGNOSIS — E870 Hyperosmolality and hypernatremia: Secondary | ICD-10-CM | POA: Diagnosis not present

## 2019-10-01 DIAGNOSIS — R578 Other shock: Secondary | ICD-10-CM | POA: Diagnosis not present

## 2019-10-01 DIAGNOSIS — R2689 Other abnormalities of gait and mobility: Secondary | ICD-10-CM | POA: Diagnosis present

## 2019-10-01 DIAGNOSIS — I10 Essential (primary) hypertension: Secondary | ICD-10-CM | POA: Diagnosis not present

## 2019-10-01 DIAGNOSIS — G935 Compression of brain: Secondary | ICD-10-CM | POA: Diagnosis present

## 2019-10-01 DIAGNOSIS — D320 Benign neoplasm of cerebral meninges: Principal | ICD-10-CM | POA: Diagnosis present

## 2019-10-01 DIAGNOSIS — G9349 Other encephalopathy: Secondary | ICD-10-CM | POA: Diagnosis present

## 2019-10-01 DIAGNOSIS — Z4659 Encounter for fitting and adjustment of other gastrointestinal appliance and device: Secondary | ICD-10-CM

## 2019-10-01 DIAGNOSIS — R4020 Unspecified coma: Secondary | ICD-10-CM | POA: Diagnosis not present

## 2019-10-01 DIAGNOSIS — Z01818 Encounter for other preprocedural examination: Secondary | ICD-10-CM

## 2019-10-01 DIAGNOSIS — K5900 Constipation, unspecified: Secondary | ICD-10-CM | POA: Diagnosis not present

## 2019-10-01 DIAGNOSIS — Z66 Do not resuscitate: Secondary | ICD-10-CM | POA: Diagnosis not present

## 2019-10-01 DIAGNOSIS — Z9911 Dependence on respirator [ventilator] status: Secondary | ICD-10-CM

## 2019-10-01 DIAGNOSIS — G932 Benign intracranial hypertension: Secondary | ICD-10-CM | POA: Diagnosis present

## 2019-10-01 DIAGNOSIS — J9601 Acute respiratory failure with hypoxia: Secondary | ICD-10-CM | POA: Diagnosis not present

## 2019-10-01 DIAGNOSIS — R7303 Prediabetes: Secondary | ICD-10-CM | POA: Diagnosis present

## 2019-10-01 DIAGNOSIS — E878 Other disorders of electrolyte and fluid balance, not elsewhere classified: Secondary | ICD-10-CM | POA: Diagnosis not present

## 2019-10-01 DIAGNOSIS — H5704 Mydriasis: Secondary | ICD-10-CM | POA: Diagnosis not present

## 2019-10-01 DIAGNOSIS — Z20822 Contact with and (suspected) exposure to covid-19: Secondary | ICD-10-CM | POA: Diagnosis present

## 2019-10-01 DIAGNOSIS — L89152 Pressure ulcer of sacral region, stage 2: Secondary | ICD-10-CM | POA: Diagnosis not present

## 2019-10-01 DIAGNOSIS — G253 Myoclonus: Secondary | ICD-10-CM | POA: Diagnosis not present

## 2019-10-01 DIAGNOSIS — Z79899 Other long term (current) drug therapy: Secondary | ICD-10-CM

## 2019-10-01 DIAGNOSIS — G936 Cerebral edema: Secondary | ICD-10-CM | POA: Diagnosis present

## 2019-10-01 DIAGNOSIS — Z6841 Body Mass Index (BMI) 40.0 and over, adult: Secondary | ICD-10-CM

## 2019-10-01 DIAGNOSIS — Z515 Encounter for palliative care: Secondary | ICD-10-CM | POA: Diagnosis not present

## 2019-10-01 DIAGNOSIS — Z9181 History of falling: Secondary | ICD-10-CM

## 2019-10-01 DIAGNOSIS — Z7189 Other specified counseling: Secondary | ICD-10-CM

## 2019-10-01 DIAGNOSIS — J449 Chronic obstructive pulmonary disease, unspecified: Secondary | ICD-10-CM | POA: Diagnosis present

## 2019-10-01 DIAGNOSIS — L8990 Pressure ulcer of unspecified site, unspecified stage: Secondary | ICD-10-CM | POA: Insufficient documentation

## 2019-10-01 DIAGNOSIS — R739 Hyperglycemia, unspecified: Secondary | ICD-10-CM | POA: Diagnosis not present

## 2019-10-01 DIAGNOSIS — F17210 Nicotine dependence, cigarettes, uncomplicated: Secondary | ICD-10-CM | POA: Diagnosis present

## 2019-10-01 DIAGNOSIS — H5500 Unspecified nystagmus: Secondary | ICD-10-CM | POA: Diagnosis not present

## 2019-10-01 DIAGNOSIS — R001 Bradycardia, unspecified: Secondary | ICD-10-CM | POA: Diagnosis not present

## 2019-10-01 DIAGNOSIS — E876 Hypokalemia: Secondary | ICD-10-CM | POA: Diagnosis not present

## 2019-10-01 DIAGNOSIS — J309 Allergic rhinitis, unspecified: Secondary | ICD-10-CM | POA: Diagnosis present

## 2019-10-01 LAB — CBC
HCT: 48.8 % — ABNORMAL HIGH (ref 36.0–46.0)
Hemoglobin: 15.6 g/dL — ABNORMAL HIGH (ref 12.0–15.0)
MCH: 28.9 pg (ref 26.0–34.0)
MCHC: 32 g/dL (ref 30.0–36.0)
MCV: 90.4 fL (ref 80.0–100.0)
Platelets: 302 10*3/uL (ref 150–400)
RBC: 5.4 MIL/uL — ABNORMAL HIGH (ref 3.87–5.11)
RDW: 14.3 % (ref 11.5–15.5)
WBC: 6.8 10*3/uL (ref 4.0–10.5)
nRBC: 0 % (ref 0.0–0.2)

## 2019-10-01 LAB — DIFFERENTIAL
Abs Immature Granulocytes: 0.01 10*3/uL (ref 0.00–0.07)
Basophils Absolute: 0 10*3/uL (ref 0.0–0.1)
Basophils Relative: 0 %
Eosinophils Absolute: 0.1 10*3/uL (ref 0.0–0.5)
Eosinophils Relative: 2 %
Immature Granulocytes: 0 %
Lymphocytes Relative: 25 %
Lymphs Abs: 1.7 10*3/uL (ref 0.7–4.0)
Monocytes Absolute: 0.6 10*3/uL (ref 0.1–1.0)
Monocytes Relative: 8 %
Neutro Abs: 4.4 10*3/uL (ref 1.7–7.7)
Neutrophils Relative %: 65 %

## 2019-10-01 LAB — PROTIME-INR
INR: 1 (ref 0.8–1.2)
Prothrombin Time: 13.1 seconds (ref 11.4–15.2)

## 2019-10-01 LAB — COMPREHENSIVE METABOLIC PANEL
ALT: 16 U/L (ref 0–44)
AST: 15 U/L (ref 15–41)
Albumin: 3.8 g/dL (ref 3.5–5.0)
Alkaline Phosphatase: 77 U/L (ref 38–126)
Anion gap: 8 (ref 5–15)
BUN: 10 mg/dL (ref 8–23)
CO2: 23 mmol/L (ref 22–32)
Calcium: 9.3 mg/dL (ref 8.9–10.3)
Chloride: 106 mmol/L (ref 98–111)
Creatinine, Ser: 0.65 mg/dL (ref 0.44–1.00)
GFR calc Af Amer: >60 mL/min (ref >60)
GFR calc non Af Amer: >60 mL/min (ref >60)
Glucose, Bld: 91 mg/dL (ref 70–99)
Potassium: 4 mmol/L (ref 3.5–5.1)
Sodium: 137 mmol/L (ref 135–145)
Total Bilirubin: 0.5 mg/dL (ref 0.3–1.2)
Total Protein: 8.2 g/dL — ABNORMAL HIGH (ref 6.5–8.1)

## 2019-10-01 LAB — APTT: aPTT: 33 seconds (ref 24–36)

## 2019-10-01 MED ORDER — SODIUM CHLORIDE 0.9% FLUSH
3.0000 mL | Freq: Once | INTRAVENOUS | Status: DC
Start: 2019-10-01 — End: 2019-10-23

## 2019-10-01 NOTE — ED Triage Notes (Signed)
Pt says two weeks ago, she started having a headache. Wednesday, felt off balance, fell, and had LOC, injured her leg. Says yesterday, she was driving her husband to dialysis and got lost.

## 2019-10-02 ENCOUNTER — Inpatient Hospital Stay (HOSPITAL_COMMUNITY): Payer: Self-pay

## 2019-10-02 DIAGNOSIS — D332 Benign neoplasm of brain, unspecified: Secondary | ICD-10-CM

## 2019-10-02 LAB — HIV ANTIBODY (ROUTINE TESTING W REFLEX): HIV Screen 4th Generation wRfx: NONREACTIVE

## 2019-10-02 LAB — SARS CORONAVIRUS 2 (TAT 6-24 HRS): SARS Coronavirus 2: NEGATIVE

## 2019-10-02 LAB — TYPE AND SCREEN
ABO/RH(D): A POS
Antibody Screen: NEGATIVE

## 2019-10-02 MED ORDER — METHOCARBAMOL 1000 MG/10ML IJ SOLN
500.0000 mg | Freq: Four times a day (QID) | INTRAVENOUS | Status: DC | PRN
  Filled 2019-10-02: qty 5

## 2019-10-02 MED ORDER — ACETAMINOPHEN 325 MG PO TABS
650.0000 mg | ORAL_TABLET | Freq: Four times a day (QID) | ORAL | Status: DC | PRN
  Administered 2019-10-06 – 2019-10-07 (×2): 650 mg via ORAL
  Filled 2019-10-02 (×2): qty 2

## 2019-10-02 MED ORDER — BISACODYL 10 MG RE SUPP: 10.0000 mg | Freq: Every day | RECTAL | Status: DC | PRN

## 2019-10-02 MED ORDER — DOCUSATE SODIUM 100 MG PO CAPS
100.0000 mg | ORAL_CAPSULE | Freq: Two times a day (BID) | ORAL | Status: DC
  Administered 2019-10-02 – 2019-10-07 (×10): 100 mg via ORAL
  Filled 2019-10-02 (×11): qty 1

## 2019-10-02 MED ORDER — DEXAMETHASONE SODIUM PHOSPHATE 4 MG/ML IJ SOLN
4.0000 mg | Freq: Four times a day (QID) | INTRAMUSCULAR | Status: DC
  Administered 2019-10-02 – 2019-10-23 (×84): 4 mg via INTRAVENOUS
  Filled 2019-10-02 (×83): qty 1

## 2019-10-02 MED ORDER — HYDROMORPHONE HCL 1 MG/ML IJ SOLN
0.5000 mg | INTRAMUSCULAR | Status: DC | PRN
  Administered 2019-10-02: 1 mg via INTRAVENOUS
  Filled 2019-10-02: qty 1

## 2019-10-02 MED ORDER — GADOBUTROL 1 MMOL/ML IV SOLN
10.0000 mL | Freq: Once | INTRAVENOUS | Status: AC | PRN
  Administered 2019-10-02: 10 mL via INTRAVENOUS

## 2019-10-02 MED ORDER — ONDANSETRON HCL 4 MG PO TABS: 4.0000 mg | ORAL_TABLET | Freq: Four times a day (QID) | ORAL | Status: DC | PRN

## 2019-10-02 MED ORDER — SODIUM CHLORIDE 0.9 % IV SOLN: INTRAVENOUS | Status: DC

## 2019-10-02 MED ORDER — ACETAMINOPHEN 650 MG RE SUPP: 650.0000 mg | Freq: Four times a day (QID) | RECTAL | Status: DC | PRN

## 2019-10-02 MED ORDER — OXYCODONE HCL 5 MG PO TABS
5.0000 mg | ORAL_TABLET | ORAL | Status: DC | PRN
  Administered 2019-10-02 – 2019-10-07 (×7): 5 mg via ORAL
  Filled 2019-10-02 (×7): qty 1

## 2019-10-02 MED ORDER — CEFAZOLIN SODIUM-DEXTROSE 2-4 GM/100ML-% IV SOLN
2.0000 g | INTRAVENOUS | Status: AC
  Administered 2019-10-08: 2 g via INTRAVENOUS

## 2019-10-02 MED ORDER — ONDANSETRON HCL 4 MG/2ML IJ SOLN
4.0000 mg | Freq: Four times a day (QID) | INTRAMUSCULAR | Status: DC | PRN
  Administered 2019-10-02: 4 mg via INTRAVENOUS
  Filled 2019-10-02: qty 2

## 2019-10-02 MED ORDER — SIMVASTATIN 20 MG PO TABS
10.0000 mg | ORAL_TABLET | Freq: Every day | ORAL | Status: DC
  Administered 2019-10-02 – 2019-10-07 (×5): 10 mg via ORAL
  Filled 2019-10-02 (×5): qty 1

## 2019-10-02 MED ORDER — ADULT MULTIVITAMIN W/MINERALS CH
1.0000 | ORAL_TABLET | Freq: Every day | ORAL | Status: DC
  Administered 2019-10-02 – 2019-10-07 (×5): 1 via ORAL
  Filled 2019-10-02 (×5): qty 1

## 2019-10-02 MED ORDER — LEVETIRACETAM 500 MG PO TABS
500.0000 mg | ORAL_TABLET | Freq: Two times a day (BID) | ORAL | Status: DC
  Administered 2019-10-02 – 2019-10-07 (×12): 500 mg via ORAL
  Filled 2019-10-02 (×12): qty 1

## 2019-10-02 MED ORDER — ZOLPIDEM TARTRATE 5 MG PO TABS
5.0000 mg | ORAL_TABLET | Freq: Every evening | ORAL | Status: DC | PRN
  Administered 2019-10-02 – 2019-10-05 (×4): 5 mg via ORAL
  Filled 2019-10-02 (×4): qty 1

## 2019-10-02 MED ORDER — DIPHENHYDRAMINE HCL 25 MG PO CAPS: 25.0000 mg | ORAL_CAPSULE | Freq: Four times a day (QID) | ORAL | Status: DC | PRN

## 2019-10-02 MED ORDER — MONTELUKAST SODIUM 10 MG PO TABS
10.0000 mg | ORAL_TABLET | Freq: Every day | ORAL | Status: DC
  Administered 2019-10-02 – 2019-10-07 (×6): 10 mg via ORAL
  Filled 2019-10-02 (×8): qty 1

## 2019-10-02 MED ORDER — DEXAMETHASONE SODIUM PHOSPHATE 10 MG/ML IJ SOLN
10.0000 mg | Freq: Once | INTRAMUSCULAR | Status: AC
  Administered 2019-10-02: 10 mg via INTRAVENOUS
  Filled 2019-10-02: qty 1

## 2019-10-02 MED ORDER — POLYETHYLENE GLYCOL 3350 17 G PO PACK
17.0000 g | PACK | Freq: Every day | ORAL | Status: DC | PRN
  Administered 2019-10-04 – 2019-10-06 (×2): 17 g via ORAL
  Filled 2019-10-02 (×2): qty 1

## 2019-10-02 NOTE — Progress Notes (Signed)
  NEUROSURGERY PROGRESS NOTE   Received call from Dr Christy Gentles regarding patient 64 year old with several day history of HA, dizziness and forgetfulness. 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 She is grossly neurologically intact.   Given size of lesion and associated edema, this will need to be resected in the next several days. Will admit over to [facility], obtain MRI brain and start on decadron 4mg  q 6 hours (already given 10mg  by EDP). Will see patient when she arrives to [facility]. Full H&P to follow.

## 2019-10-02 NOTE — ED Notes (Signed)
Patient is sleeping at this time.

## 2019-10-02 NOTE — ED Notes (Signed)
Patient provided with water for stroke swallow screen. Patient passed.

## 2019-10-02 NOTE — ED Provider Notes (Signed)
[facility] DEPT Provider Note   CSN: [id number] Arrival date & time: 09/05/2019  2110     History Chief Complaint  Patient presents with  . Dizziness    Kimberly Daniel is a 64 y.o. female.  The history is provided by the patient.  Dizziness Quality:  Room spinning Severity:  Moderate Onset quality:  Gradual Duration:  1 week Timing:  Intermittent Progression:  Worsening Chronicity:  New Relieved by:  Being still Worsened by:  Movement Associated symptoms: headaches   Associated symptoms: no chest pain and no vomiting   Patient with history of diabetes and COPD presents with headache and dizziness.  She reports approximately 1 week ago she fell down.  Since that time she began having dizziness.  She also reports headaches.  She is also reporting forgetfulness.  She reports while driving her husband to dialysis she got lost which is unusual for her.  No new visual changes.  No vomiting.  No fevers.     Past Medical History:  Diagnosis Date  . COPD (chronic obstructive pulmonary disease) ([place])   . Diabetes mellitus without complication ([place])     There are no problems to display for this patient.   Past Surgical History:  Procedure Laterality Date  . KNEE SURGERY    . TUBAL LIGATION       OB History   No obstetric history on file.     No family history on file.  Social History   Tobacco Use  . Smoking status: Current Some Day Smoker  . Smokeless tobacco: Never Used  Substance Use Topics  . Alcohol use: No  . Drug use: No    Home Medications Prior to Admission medications   Medication Sig Start Date End Date Taking? Authorizing Provider  acetaminophen (TYLENOL) 325 MG tablet Take 2 tablets (650 mg total) by mouth every 6 (six) hours as needed. 09/10/15   Sam, Olivia Canter, PA-C  benzonatate (TESSALON) 100 MG capsule Take 1 capsule (100 mg total) by mouth every 8 (eight) hours. 09/10/15   Sam, Olivia Canter, PA-C  clotrimazole  (LOTRIMIN) 1 % cream Apply to affected area 2 times daily 04/14/19   Isla Pence, MD  dextromethorphan (DELSYM) 30 MG/5ML liquid Take 30 mg by mouth at bedtime as needed for cough.    [provider]  fluticasone (FLONASE) 50 MCG/ACT nasal spray Place 2 sprays into both nostrils daily. 09/10/15   Sam, Olivia Canter, PA-C  ibuprofen (ADVIL,MOTRIN) 200 MG tablet Take 400 mg by mouth every 6 (six) hours as needed. FOR PAIN    [provider]  Multiple Vitamin (MULTIVITAMIN WITH MINERALS) TABS Take 1 tablet by mouth daily. 50 PLUS CENTRUM    [provider]  ondansetron (ZOFRAN ODT) 4 MG disintegrating tablet Take 1 tablet (4 mg total) by mouth every 8 (eight) hours as needed for nausea or vomiting. 09/10/15   Sam, Olivia Canter, PA-C  Phenyleph-Doxylamine-DM-APAP (ALKA SELTZER PLUS PO) Take 1 tablet by mouth daily as needed (cold symptoms).    [provider]  Pramoxine HCl 1 % CREA Apply 1 inch topically 4 (four) times daily. 04/14/19   Isla Pence, MD    Allergies    Patient has no known allergies.  Review of Systems   Review of Systems  Constitutional: Negative for fever.  Eyes: Negative for visual disturbance.  Cardiovascular: Negative for chest pain.  Gastrointestinal: Negative for vomiting.  Neurological: Positive for dizziness and headaches.  All other systems  reviewed and are negative.   Physical Exam Updated Vital Signs BP (!) 143/91   Pulse 76   Temp 97.7 F (36.5 C) (Oral)   Resp 19   Ht 1.676 m (5\' 6" )   Wt 114.3 kg   SpO2 95%   BMI 40.67 kg/m   Physical Exam CONSTITUTIONAL: Well developed/well nourished HEAD: Normocephalic/atraumatic EYES: EOMI/PERRL ENMT: Mucous membranes moist NECK: supple no meningeal signs SPINE/BACK:entire spine nontender CV: S1/S2 noted, no murmurs/rubs/gallops noted LUNGS: Lungs are clear to auscultation bilaterally, no apparent distress ABDOMEN: soft, nontender, no rebound or guarding, bowel sounds noted  throughout abdomen GU:no cva tenderness NEURO: Pt is awake/alert/appropriate, moves all extremitiesx4.  No facial droop.  No arm drift.  No past-pointing. Mild drift noted of the right leg.  Appropriate strength noted left leg No sensory deficit noted throughout extremities EXTREMITIES: pulses normal/equal, full ROM SKIN: warm, color normal PSYCH: no abnormalities of mood noted, alert and oriented to situation  ED Results / Procedures / Treatments   Labs (all labs ordered are listed, but only abnormal results are displayed) Labs Reviewed  CBC - Abnormal; Notable for the following components:      Result Value   RBC 5.40 (*)    Hemoglobin 15.6 (*)    HCT 48.8 (*)    All other components within normal limits  COMPREHENSIVE METABOLIC PANEL - Abnormal; Notable for the following components:   Total Protein 8.2 (*)    All other components within normal limits  SARS CORONAVIRUS 2 (TAT 6-24 HRS)  PROTIME-INR  APTT  DIFFERENTIAL  HIV ANTIBODY (ROUTINE TESTING W REFLEX)    EKG EKG Interpretation  Date/Time:  Tuesday October 01 2019 21:37:31 EDT Ventricular Rate:  73 PR Interval:    QRS Duration: 86 QT Interval:  403 QTC Calculation: 445 R Axis:   -38 Text Interpretation: Sinus rhythm Left ventricular hypertrophy Inferior infarct, old Anterior Q waves, possibly due to LVH No significant change since last tracing Confirmed by Ripley Fraise [phone]) on 10/02/2019 12:28:59 AM   Radiology CT HEAD WO CONTRAST  Result Date: 10/02/2019 CLINICAL DATA:  Dizziness and headaches EXAM: CT HEAD WITHOUT CONTRAST TECHNIQUE: Contiguous axial images were obtained from the base of the skull through the vertex without intravenous contrast. COMPARISON:  None. FINDINGS: Brain: There is a partially calcified mass along the right frontal convexity that measures 2.1 x 2.7 cm. There is severe vasogenic edema within the right frontal lobe causing 10 mm of leftward midline shift at the level of the foramina of  [place]. No acute hemorrhage. Basal cisterns are effaced. There is impending subfalcine herniation of the cingulate gyrus. Vascular: No abnormal hyperdensity of the major intracranial arteries or dural venous sinuses. No intracranial atherosclerosis. Skull: The visualized skull base, calvarium and extracranial soft tissues are normal. Sinuses/Orbits: No fluid levels or advanced mucosal thickening of the visualized paranasal sinuses. No mastoid or middle ear effusion. The orbits are normal. IMPRESSION: 1. Right frontal lobe severe vasogenic edema and 10 mm of leftward midline shift, effacement of the basilar cisterns and impending subfalcine herniation of the cingulate gyrus. 2. Partially calcified mass abutting the right frontal lobe is likely a meningioma. While this may be a source of the right frontal edema, there also could be an intraparenchymal lesion not visible by CT. MRI of the head with and without contrast might be helpful for further evaluation. 3. No acute hemorrhage. Electronically Signed   By: Ulyses Jarred M.D.   On: 09/18/2019 23:06    Procedures .  Critical Care Performed by: Ripley Fraise, MD Authorized by: Ripley Fraise, MD   Critical care provider statement:    Critical care time (minutes):  45   Critical care start time:  10/02/2019 1:15 AM   Critical care end time:  10/02/2019 2:00 AM   Critical care time was exclusive of:  Separately billable procedures and treating other patients   Critical care was necessary to treat or prevent imminent or life-threatening deterioration of the following conditions:  CNS failure or compromise   Critical care was time spent personally by me on the following activities:  Development of treatment plan with patient or surrogate, discussions with consultants, examination of patient, ordering and review of radiographic studies, ordering and review of laboratory studies, pulse oximetry, re-evaluation of patient's condition, ordering and performing  treatments and interventions and evaluation of patient's response to treatment   I assumed direction of critical care for this patient from another provider in my specialty: no      Medications Ordered in ED Medications  sodium chloride flush (NS) 0.9 % injection 3 mL (has no administration in time range)  simvastatin (ZOCOR) tablet 10 mg (has no administration in time range)  multivitamin with minerals tablet 1 tablet (has no administration in time range)  diphenhydrAMINE (BENADRYL) capsule 25 mg (has no administration in time range)  montelukast (SINGULAIR) tablet 10 mg (has no administration in time range)  zolpidem (AMBIEN) tablet 5 mg (has no administration in time range)  docusate sodium (COLACE) capsule 100 mg (has no administration in time range)  polyethylene glycol (MIRALAX / GLYCOLAX) packet 17 g (has no administration in time range)  bisacodyl (DULCOLAX) suppository 10 mg (has no administration in time range)  ondansetron (ZOFRAN) tablet 4 mg (has no administration in time range)    Or  ondansetron (ZOFRAN) injection 4 mg (has no administration in time range)  0.9 %  sodium chloride infusion (has no administration in time range)  methocarbamol (ROBAXIN) 500 mg in dextrose 5 % 50 mL IVPB (has no administration in time range)  HYDROmorphone (DILAUDID) injection 0.5-1 mg (has no administration in time range)  oxyCODONE (Oxy IR/ROXICODONE) immediate release tablet 5 mg (has no administration in time range)  acetaminophen (TYLENOL) tablet 650 mg (has no administration in time range)    Or  acetaminophen (TYLENOL) suppository 650 mg (has no administration in time range)  levETIRAcetam (KEPPRA) tablet 500 mg (has no administration in time range)  dexamethasone (DECADRON) injection 4 mg (has no administration in time range)  dexamethasone (DECADRON) injection 10 mg (10 mg Intravenous Given 10/02/19 0204)    ED Course  I have reviewed the triage vital signs and the nursing  notes.  Pertinent labs & imaging results that were available during my care of the patient were reviewed by me and considered in my medical decision making (see chart for details).    MDM Rules/Calculators/A&P                       This patient presents to the ED for concern of headache and dizziness, this involves an extensive number of treatment options, and is a complaint that carries with it a high risk of complications and morbidity.  The differential diagnosis includes stroke, meningitis, intracranial hemorrhage, brain tumor   Lab Tests:   I Ordered, reviewed, and interpreted labs, which included electrolytes, complete blood count  Medicines ordered:   I ordered medication Decadron for vasogenic edema  Imaging Studies ordered:   I  ordered imaging studies which included CT head   I independently visualized and interpreted imaging which showed brain tumor  Additional history obtained:    Previous records obtained and reviewed   Consultations Obtained:   I consulted neurosurgery and discussed lab and imaging findings  Reevaluation:  After the interventions stated above, I reevaluated the patient and found patient is stable  Critical Interventions:  . Admission to neurosurgery for operative management  Patient presents with headache and dizziness for the past week.  She also reports forgetfulness.  CT head reveals large brain tumor with vasogenic edema and midline shift.  Patient was given Decadron.  Discussed with neurosurgery who will admit the patient for operative repair.  Patient was updated on plan.  She is awake alert at this time.  Patient agrees with the plan. . Final Clinical Impression(s) / ED Diagnoses Final diagnoses:  Benign neoplasm of brain, unspecified brain region [facility])    Rx / [facility] Orders ED Discharge Orders    None       Ripley Fraise, MD 10/02/19 [phone]

## 2019-10-02 NOTE — ED Notes (Signed)
Patient is confused stating she does not know where she is at.

## 2019-10-02 NOTE — H&P (Signed)
4 MG disintegrating tablet Take 1 tablet (4 mg total) by mouth every 8 (eight) hours as needed for nausea or vomiting. Patient not taking: Reported on 10/02/2019 09/10/15   Sam, Olivia Canter, PA-C  Pramoxine HCl 1 % CREA Apply 1 inch topically 4 (four) times daily. Patient not taking: Reported on 10/02/2019 04/14/19   Isla Pence, MD    ALLERGY: No Known Allergies  Social History   Tobacco Use  . Smoking status: Current Some Day Smoker  . Smokeless tobacco: Never Used  Substance Use Topics  . Alcohol use: No     No family history  on file.   ROS   Review of Systems  Constitutional: Negative.   HENT: Negative.   Eyes: Negative for blurred vision, double vision and photophobia.  Respiratory: Negative.   Cardiovascular: Negative.   Gastrointestinal: Negative for nausea and vomiting.  Genitourinary: Negative.   Musculoskeletal: Positive for joint pain (bilateral knee). Negative for myalgias and neck pain.  Skin: Negative.   Neurological: Positive for headaches. Negative for dizziness, tingling, tremors, sensory change, speech change, focal weakness and weakness.    Exam   Vitals:   10/02/19 0738 10/02/19 0800  BP:  139/89  Pulse: 84 78  Resp:    Temp:    SpO2: 93% 91%   General appearance: WDWN, NAD Eyes: No scleral injection Cardiovascular: Regular rate and rhythm without murmurs, rubs, gallops. No edema or variciosities. Distal pulses normal. Pulmonary: Effort normal, non-labored breathing Musculoskeletal:     Muscle tone upper extremities: Normal    Muscle tone lower extremities: Normal    Motor exam: Upper Extremities Deltoid Bicep Tricep Grip  Right 5/5 5/5 5/5 5/5  Left 5/5 5/5 5/5 5/5   Lower Extremity IP Quad PF DF EHL  Right 5/5 5/5 5/5 5/5 5/5  Left 5/5 5/5 5/5 5/5 5/5   Neurological Mental Status:    - Patient is awake, alert, oriented to person, place, month, year, and situation    - Patient is able to give a clear and coherent history.    - No signs of aphasia or neglect Cranial Nerves    - II: Visual Fields are full. PERRL    - III/IV/VI: EOMI without ptosis or diploplia.     - V: Facial sensation is grossly normal    - VII: Facial movement is symmetric.     - VIII: hearing is intact to voice    - X: Uvula elevates symmetrically    - XI: Shoulder shrug is symmetric.    - XII: tongue is midline without atrophy or fasciculations.  Sensory: Sensation grossly intact to LT Deep Tendon Reflexes    - 2+ and symmetric in the biceps and patellae.  Plantars   - Toes are downgoing  bilaterally. Cerebellar     - FNF and HKS slow but intact   No drift  Results - Imaging/Labs   Results for orders placed or performed during the hospital encounter of 09/14/2019 (from the past 48 hour(s))  Protime-INR     Status: None   Collection Time: 09/21/2019  9:43 PM  Result Value Ref Range   Prothrombin Time 13.1 11.4 - 15.2 seconds   INR 1.0 0.8 - 1.2    Comment: (NOTE) INR goal varies based on device and disease states. Performed at [facility], [place] [address], [place], [place] [address]   APTT     Status: None   Collection Time: 10/04/2019  9:43 PM  Result Value Ref  4 MG disintegrating tablet Take 1 tablet (4 mg total) by mouth every 8 (eight) hours as needed for nausea or vomiting. Patient not taking: Reported on 10/02/2019 09/10/15   Sam, Olivia Canter, PA-C  Pramoxine HCl 1 % CREA Apply 1 inch topically 4 (four) times daily. Patient not taking: Reported on 10/02/2019 04/14/19   Isla Pence, MD    ALLERGY: No Known Allergies  Social History   Tobacco Use  . Smoking status: Current Some Day Smoker  . Smokeless tobacco: Never Used  Substance Use Topics  . Alcohol use: No     No family history  on file.   ROS   Review of Systems  Constitutional: Negative.   HENT: Negative.   Eyes: Negative for blurred vision, double vision and photophobia.  Respiratory: Negative.   Cardiovascular: Negative.   Gastrointestinal: Negative for nausea and vomiting.  Genitourinary: Negative.   Musculoskeletal: Positive for joint pain (bilateral knee). Negative for myalgias and neck pain.  Skin: Negative.   Neurological: Positive for headaches. Negative for dizziness, tingling, tremors, sensory change, speech change, focal weakness and weakness.    Exam   Vitals:   10/02/19 0738 10/02/19 0800  BP:  139/89  Pulse: 84 78  Resp:    Temp:    SpO2: 93% 91%   General appearance: WDWN, NAD Eyes: No scleral injection Cardiovascular: Regular rate and rhythm without murmurs, rubs, gallops. No edema or variciosities. Distal pulses normal. Pulmonary: Effort normal, non-labored breathing Musculoskeletal:     Muscle tone upper extremities: Normal    Muscle tone lower extremities: Normal    Motor exam: Upper Extremities Deltoid Bicep Tricep Grip  Right 5/5 5/5 5/5 5/5  Left 5/5 5/5 5/5 5/5   Lower Extremity IP Quad PF DF EHL  Right 5/5 5/5 5/5 5/5 5/5  Left 5/5 5/5 5/5 5/5 5/5   Neurological Mental Status:    - Patient is awake, alert, oriented to person, place, month, year, and situation    - Patient is able to give a clear and coherent history.    - No signs of aphasia or neglect Cranial Nerves    - II: Visual Fields are full. PERRL    - III/IV/VI: EOMI without ptosis or diploplia.     - V: Facial sensation is grossly normal    - VII: Facial movement is symmetric.     - VIII: hearing is intact to voice    - X: Uvula elevates symmetrically    - XI: Shoulder shrug is symmetric.    - XII: tongue is midline without atrophy or fasciculations.  Sensory: Sensation grossly intact to LT Deep Tendon Reflexes    - 2+ and symmetric in the biceps and patellae.  Plantars   - Toes are downgoing  bilaterally. Cerebellar     - FNF and HKS slow but intact   No drift  Results - Imaging/Labs   Results for orders placed or performed during the hospital encounter of 09/14/2019 (from the past 48 hour(s))  Protime-INR     Status: None   Collection Time: 09/21/2019  9:43 PM  Result Value Ref Range   Prothrombin Time 13.1 11.4 - 15.2 seconds   INR 1.0 0.8 - 1.2    Comment: (NOTE) INR goal varies based on device and disease states. Performed at [facility], [place] [address], [place], [place] [address]   APTT     Status: None   Collection Time: 10/04/2019  9:43 PM  Result Value Ref  Range   aPTT 33 24 - 36 seconds    Comment: Performed at [facility], [place] [address], [place], [place] [address]  CBC     Status: Abnormal   Collection Time: 09/18/2019  9:43 PM  Result Value Ref Range   WBC 6.8 4.0 - 10.5 K/uL   RBC 5.40 (H) 3.87 - 5.11 MIL/uL   Hemoglobin 15.6 (H) 12.0 - 15.0 g/dL   HCT 48.8 (H) 36.0 - 46.0 %   MCV 90.4 80.0 - 100.0 fL   MCH 28.9 26.0 - 34.0 pg   MCHC 32.0 30.0 - 36.0 g/dL   RDW 14.3 11.5 - 15.5 %   Platelets 302 150 - 400 K/uL   nRBC 0.0 0.0 - 0.2 %    Comment: Performed at [facility], [place] [address] [place], [place] [address]  Differential     Status: None   Collection Time: 09/16/2019  9:43 PM  Result Value Ref Range   Neutrophils Relative % 65 %   Neutro Abs 4.4 1.7 - 7.7 K/uL   Lymphocytes Relative 25 %   Lymphs Abs 1.7 0.7 - 4.0 K/uL   Monocytes Relative 8 %   Monocytes Absolute 0.6 0.1 - 1.0 K/uL   Eosinophils Relative 2 %   Eosinophils Absolute 0.1 0.0 - 0.5 K/uL   Basophils Relative 0 %   Basophils Absolute 0.0 0.0 - 0.1 K/uL   Immature Granulocytes 0 %   Abs Immature Granulocytes 0.01 0.00 - 0.07 K/uL    Comment: Performed at [facility], [place] [address] [place], [place] [address]  Comprehensive metabolic panel     Status: Abnormal   Collection Time: 09/28/2019  9:43 PM   Result Value Ref Range   Sodium 137 135 - 145 mmol/L   Potassium 4.0 3.5 - 5.1 mmol/L   Chloride 106 98 - 111 mmol/L   CO2 23 22 - 32 mmol/L   Glucose, Bld 91 70 - 99 mg/dL    Comment: Glucose reference range applies only to samples taken after fasting for at least 8 hours.   BUN 10 8 - 23 mg/dL   Creatinine, Ser 0.65 0.44 - 1.00 mg/dL   Calcium 9.3 8.9 - 10.3 mg/dL   Total Protein 8.2 (H) 6.5 - 8.1 g/dL   Albumin 3.8 3.5 - 5.0 g/dL   AST 15 15 - 41 U/L   ALT 16 0 - 44 U/L   Alkaline Phosphatase 77 38 - 126 U/L   Total Bilirubin 0.5 0.3 - 1.2 mg/dL   GFR calc non Af Amer >60 >60 mL/min   GFR calc Af Amer >60 >60 mL/min   Anion gap 8 5 - 15    Comment: Performed at [facility], [place] [address] [place], [place] [address]  HIV Antibody (routine testing w rflx)     Status: None   Collection Time: 10/02/19  3:38 AM  Result Value Ref Range   HIV Screen 4th Generation wRfx Non Reactive Non Reactive    Comment: Performed at [facility], [place] [address] [place], [place] [address]    CT HEAD WO CONTRAST  Result Date: 09/13/2019 CLINICAL DATA:  Dizziness and headaches EXAM: CT HEAD WITHOUT CONTRAST TECHNIQUE: Contiguous axial images were obtained from the base of the skull through the vertex without intravenous contrast. COMPARISON:  None. FINDINGS: Brain: There is a partially calcified mass along the right frontal convexity that measures 2.1 x 2.7 cm. There is  4 MG disintegrating tablet Take 1 tablet (4 mg total) by mouth every 8 (eight) hours as needed for nausea or vomiting. Patient not taking: Reported on 10/02/2019 09/10/15   Sam, Olivia Canter, PA-C  Pramoxine HCl 1 % CREA Apply 1 inch topically 4 (four) times daily. Patient not taking: Reported on 10/02/2019 04/14/19   Isla Pence, MD    ALLERGY: No Known Allergies  Social History   Tobacco Use  . Smoking status: Current Some Day Smoker  . Smokeless tobacco: Never Used  Substance Use Topics  . Alcohol use: No     No family history  on file.   ROS   Review of Systems  Constitutional: Negative.   HENT: Negative.   Eyes: Negative for blurred vision, double vision and photophobia.  Respiratory: Negative.   Cardiovascular: Negative.   Gastrointestinal: Negative for nausea and vomiting.  Genitourinary: Negative.   Musculoskeletal: Positive for joint pain (bilateral knee). Negative for myalgias and neck pain.  Skin: Negative.   Neurological: Positive for headaches. Negative for dizziness, tingling, tremors, sensory change, speech change, focal weakness and weakness.    Exam   Vitals:   10/02/19 0738 10/02/19 0800  BP:  139/89  Pulse: 84 78  Resp:    Temp:    SpO2: 93% 91%   General appearance: WDWN, NAD Eyes: No scleral injection Cardiovascular: Regular rate and rhythm without murmurs, rubs, gallops. No edema or variciosities. Distal pulses normal. Pulmonary: Effort normal, non-labored breathing Musculoskeletal:     Muscle tone upper extremities: Normal    Muscle tone lower extremities: Normal    Motor exam: Upper Extremities Deltoid Bicep Tricep Grip  Right 5/5 5/5 5/5 5/5  Left 5/5 5/5 5/5 5/5   Lower Extremity IP Quad PF DF EHL  Right 5/5 5/5 5/5 5/5 5/5  Left 5/5 5/5 5/5 5/5 5/5   Neurological Mental Status:    - Patient is awake, alert, oriented to person, place, month, year, and situation    - Patient is able to give a clear and coherent history.    - No signs of aphasia or neglect Cranial Nerves    - II: Visual Fields are full. PERRL    - III/IV/VI: EOMI without ptosis or diploplia.     - V: Facial sensation is grossly normal    - VII: Facial movement is symmetric.     - VIII: hearing is intact to voice    - X: Uvula elevates symmetrically    - XI: Shoulder shrug is symmetric.    - XII: tongue is midline without atrophy or fasciculations.  Sensory: Sensation grossly intact to LT Deep Tendon Reflexes    - 2+ and symmetric in the biceps and patellae.  Plantars   - Toes are downgoing  bilaterally. Cerebellar     - FNF and HKS slow but intact   No drift  Results - Imaging/Labs   Results for orders placed or performed during the hospital encounter of 09/14/2019 (from the past 48 hour(s))  Protime-INR     Status: None   Collection Time: 09/21/2019  9:43 PM  Result Value Ref Range   Prothrombin Time 13.1 11.4 - 15.2 seconds   INR 1.0 0.8 - 1.2    Comment: (NOTE) INR goal varies based on device and disease states. Performed at [facility], [place] [address], [place], [place] [address]   APTT     Status: None   Collection Time: 10/04/2019  9:43 PM  Result Value Ref

## 2019-10-02 NOTE — ED Notes (Signed)
Patient transferred to MRI 

## 2019-10-02 NOTE — ED Notes (Signed)
Patient ambulated to the bathroom as per her request. Patient had a shuffling gait and patient states she normally does not walk this way. STEDY used to take the patient back to her room.

## 2019-10-02 NOTE — ED Notes (Signed)
ED TO INPATIENT HANDOFF REPORT  ED Nurse Name and Phone #: [phone]  S Name/Age/Gender Kimberly Daniel 64 y.o. female Room/Bed: WA15/WA15  Code Status   Code Status: Full Code  Home/SNF/Other Home Patient oriented to: self Is this baseline? No   Triage Complete: Triage complete  Chief Complaint Brain tumor [facility]) [D49.6]  Triage Note Pt says two weeks ago, she started having a headache. Wednesday, felt off balance, fell, and had LOC, injured her leg. Says yesterday, she was driving her husband to dialysis and got lost.     Allergies No Known Allergies  Level of Care/Admitting Diagnosis ED Disposition    ED Disposition Condition [place]: [place] [100100]  Level of Care: Progressive [102]  Admit to Progressive based on following criteria: NEUROLOGICAL AND NEUROSURGICAL complex patients with significant risk of instability, who do not meet ICU criteria, yet require close observation or frequent assessment (< / = every 2 - 4 hours) with medical / nursing intervention.  May admit patient to [facility] or [facility] if equivalent level of care is available:: No  Covid Evaluation: Asymptomatic Screening Protocol (No Symptoms)  Diagnosis: Brain tumor [facility]) [id number]  Admitting Physician: Consuella Lose [id number]  Attending Physician: Consuella Lose [id number]  Estimated length of stay: past midnight tomorrow  Certification:: I certify this patient will need inpatient services for at least 2 midnights  Bed request comments: 4NP, or any progressive unit bed available       B Medical/Surgery History Past Medical History:  Diagnosis Date  . COPD (chronic obstructive pulmonary disease) ([place])   . Diabetes mellitus without complication [facility])    Past Surgical History:  Procedure Laterality Date  . KNEE SURGERY    . TUBAL LIGATION       A IV Location/Drains/Wounds Patient Lines/Drains/Airways Status   Active  Line/Drains/Airways    Name:   Placement date:   Placement time:   Site:   Days:   Peripheral IV 10/02/19 Right Hand   10/02/19    1817    Hand   less than 1          Intake/Output Last 24 hours No intake or output data in the 24 hours ending 10/02/19 2252  Labs/Imaging Results for orders placed or performed during the hospital encounter of 09/21/2019 (from the past 48 hour(s))  Protime-INR     Status: None   Collection Time: 10/03/2019  9:43 PM  Result Value Ref Range   Prothrombin Time 13.1 11.4 - 15.2 seconds   INR 1.0 0.8 - 1.2    Comment: (NOTE) INR goal varies based on device and disease states. Performed at [facility], [place] [address] [place], [place] [address]   APTT     Status: None   Collection Time: 09/18/2019  9:43 PM  Result Value Ref Range   aPTT 33 24 - 36 seconds    Comment: Performed at [facility], [place] [address] [place], [place] [address]  CBC     Status: Abnormal   Collection Time: 09/09/2019  9:43 PM  Result Value Ref Range   WBC 6.8 4.0 - 10.5 K/uL   RBC 5.40 (H) 3.87 - 5.11 MIL/uL   Hemoglobin 15.6 (H) 12.0 - 15.0 g/dL   HCT 48.8 (H) 36.0 - 46.0 %   MCV 90.4 80.0 - 100.0 fL   MCH 28.9 26.0 - 34.0 pg   MCHC 32.0 30.0 - 36.0 g/dL  RDW 14.3 11.5 - 15.5 %   Platelets 302 150 - 400 K/uL   nRBC 0.0 0.0 - 0.2 %    Comment: Performed at [facility], [place] [address] [place], [place] [address]  Differential     Status: None   Collection Time: 09/30/2019  9:43 PM  Result Value Ref Range   Neutrophils Relative % 65 %   Neutro Abs 4.4 1.7 - 7.7 K/uL   Lymphocytes Relative 25 %   Lymphs Abs 1.7 0.7 - 4.0 K/uL   Monocytes Relative 8 %   Monocytes Absolute 0.6 0.1 - 1.0 K/uL   Eosinophils Relative 2 %   Eosinophils Absolute 0.1 0.0 - 0.5 K/uL   Basophils Relative 0 %   Basophils Absolute 0.0 0.0 - 0.1 K/uL   Immature Granulocytes 0 %   Abs Immature Granulocytes 0.01 0.00 - 0.07 K/uL    Comment:  Performed at [facility], [place] [address], [place], [place] [address]  Comprehensive metabolic panel     Status: Abnormal   Collection Time: 09/28/2019  9:43 PM  Result Value Ref Range   Sodium 137 135 - 145 mmol/L   Potassium 4.0 3.5 - 5.1 mmol/L   Chloride 106 98 - 111 mmol/L   CO2 23 22 - 32 mmol/L   Glucose, Bld 91 70 - 99 mg/dL    Comment: Glucose reference range applies only to samples taken after fasting for at least 8 hours.   BUN 10 8 - 23 mg/dL   Creatinine, Ser 0.65 0.44 - 1.00 mg/dL   Calcium 9.3 8.9 - 10.3 mg/dL   Total Protein 8.2 (H) 6.5 - 8.1 g/dL   Albumin 3.8 3.5 - 5.0 g/dL   AST 15 15 - 41 U/L   ALT 16 0 - 44 U/L   Alkaline Phosphatase 77 38 - 126 U/L   Total Bilirubin 0.5 0.3 - 1.2 mg/dL   GFR calc non Af Amer >60 >60 mL/min   GFR calc Af Amer >60 >60 mL/min   Anion gap 8 5 - 15    Comment: Performed at [facility], [place] [address], [place], [place] [address]  HIV Antibody (routine testing w rflx)     Status: None   Collection Time: 10/02/19  3:38 AM  Result Value Ref Range   HIV Screen 4th Generation wRfx Non Reactive Non Reactive    Comment: Performed at [facility], [place] [address] [place], [place] [address]  SARS CORONAVIRUS 2 (TAT 6-24 HRS) Nasopharyngeal Nasopharyngeal Swab     Status: None   Collection Time: 10/02/19  3:38 AM   Specimen: Nasopharyngeal Swab  Result Value Ref Range   SARS Coronavirus 2 NEGATIVE NEGATIVE    Comment: (NOTE) SARS-CoV-2 target nucleic acids are NOT DETECTED. The SARS-CoV-2 RNA is generally detectable in upper and lower respiratory specimens during the acute phase of infection. Negative results do not preclude SARS-CoV-2 infection, do not rule out co-infections with other pathogens, and should not be used as the sole basis for treatment or other patient management decisions. Negative results must be combined with clinical observations, patient history, and epidemiological  information. The expected result is Negative. Fact Sheet for Patients: SugarRoll.be Fact Sheet for Healthcare Providers: https://www.woods-mathews.com/ This test is not yet approved or cleared by the [place] FDA and  has been authorized for detection and/or diagnosis of SARS-CoV-2 by FDA under an Emergency Use Authorization (EUA). This EUA will remain  in effect (meaning  this test can be used) for the duration of the COVID-19 declaration under Section 56 4(b)(1) of the Act, 21 U.S.C. section 360bbb-3(b)(1), unless the authorization is terminated or revoked sooner. Performed at [facility], [place] [address], [place], [place] [address]   Type and screen [place]     Status: None   Collection Time: 10/02/19  8:53 PM  Result Value Ref Range   ABO/RH(D) A POS    Antibody Screen NEG    Sample Expiration      10/05/2019,2359 Performed at [facility], [place] [address] [place], [place] [address]   ABO/Rh     Status: None (Preliminary result)   Collection Time: 10/02/19  8:53 PM  Result Value Ref Range   ABO/RH(D)      A POS Performed at [facility], [place] [address], [place], [place] [address]    CT HEAD WO CONTRAST  Result Date: 09/12/2019 CLINICAL DATA:  Dizziness and headaches EXAM: CT HEAD WITHOUT CONTRAST TECHNIQUE: Contiguous axial images were obtained from the base of the skull through the vertex without intravenous contrast. COMPARISON:  None. FINDINGS: Brain: There is a partially calcified mass along the right frontal convexity that measures 2.1 x 2.7 cm. There is severe vasogenic edema within the right frontal lobe causing 10 mm of leftward midline shift at the level of the foramina of [place]. No acute hemorrhage. Basal cisterns are effaced. There is impending subfalcine herniation of the cingulate gyrus. Vascular: No abnormal hyperdensity of the major intracranial arteries  or dural venous sinuses. No intracranial atherosclerosis. Skull: The visualized skull base, calvarium and extracranial soft tissues are normal. Sinuses/Orbits: No fluid levels or advanced mucosal thickening of the visualized paranasal sinuses. No mastoid or middle ear effusion. The orbits are normal. IMPRESSION: 1. Right frontal lobe severe vasogenic edema and 10 mm of leftward midline shift, effacement of the basilar cisterns and impending subfalcine herniation of the cingulate gyrus. 2. Partially calcified mass abutting the right frontal lobe is likely a meningioma. While this may be a source of the right frontal edema, there also could be an intraparenchymal lesion not visible by CT. MRI of the head with and without contrast might be helpful for further evaluation. 3. No acute hemorrhage. Electronically Signed   By: Ulyses Jarred M.D.   On: 09/12/2019 23:06   MR BRAIN W WO CONTRAST  Result Date: 10/02/2019 CLINICAL DATA:  Brain mass on head CT EXAM: MRI HEAD WITHOUT AND WITH CONTRAST TECHNIQUE: Multiplanar, multiecho pulse sequences of the brain and surrounding structures were obtained without and with intravenous contrast. CONTRAST:  63mL GADAVIST GADOBUTROL 1 MMOL/ML IV SOLN COMPARISON:  Head CT from yesterday FINDINGS: Brain: Solitary dural-based right pterional mass measuring up to 3 x 3 x 2.3 cm, with dural tail. There is mass effect on the adjacent lateral frontal lobe with profound vasogenic edema throughout the much of the right cerebral white matter. Midline shift measures up to 14 mm and there is right uncal herniation No acute infarct, acute hemorrhage, or hydrocephalus Vascular: Present flow voids and vascular enhancements Skull and upper cervical spine: Negative Sinuses/Orbits: Negative IMPRESSION: 3 cm solitary, right pterional dural based mass favoring meningioma. There is contiguous profound vasogenic edema with 14 mm of midline shift and right uncal herniation. Electronically Signed   By:  Monte Fantasia M.D.   On: 10/02/2019 07:50    Pending Labs Unresulted Labs (From admission, onward)   None  Vitals/Pain Today's Vitals   10/02/19 1932 10/02/19 1939 10/02/19 2000 10/02/19 2235  BP: (!) 146/95 135/80 (!) 155/94 (!) 165/91  Pulse: 77 80 74 90  Resp: 16 17 20 20   Temp: 97.9 F (36.6 C)     TempSrc: Oral     SpO2: 93% 95% 96% 99%  Weight:      Height:      PainSc: 0-No pain       Isolation Precautions No active isolations  Medications Medications  sodium chloride flush (NS) 0.9 % injection 3 mL (has no administration in time range)  simvastatin (ZOCOR) tablet 10 mg (10 mg Oral Given 10/02/19 1234)  multivitamin with minerals tablet 1 tablet (1 tablet Oral Given 10/02/19 1233)  diphenhydrAMINE (BENADRYL) capsule 25 mg (has no administration in time range)  montelukast (SINGULAIR) tablet 10 mg (has no administration in time range)  zolpidem (AMBIEN) tablet 5 mg (has no administration in time range)  docusate sodium (COLACE) capsule 100 mg (100 mg Oral Given 10/02/19 1234)  polyethylene glycol (MIRALAX / GLYCOLAX) packet 17 g (has no administration in time range)  bisacodyl (DULCOLAX) suppository 10 mg (has no administration in time range)  ondansetron (ZOFRAN) tablet 4 mg (has no administration in time range)    Or  ondansetron (ZOFRAN) injection 4 mg (has no administration in time range)  0.9 %  sodium chloride infusion ( Intravenous New Bag/Given 10/02/19 1601)  methocarbamol (ROBAXIN) 500 mg in dextrose 5 % 50 mL IVPB (has no administration in time range)  HYDROmorphone (DILAUDID) injection 0.5-1 mg (has no administration in time range)  oxyCODONE (Oxy IR/ROXICODONE) immediate release tablet 5 mg (has no administration in time range)  acetaminophen (TYLENOL) tablet 650 mg (has no administration in time range)    Or  acetaminophen (TYLENOL) suppository 650 mg (has no administration in time range)  levETIRAcetam (KEPPRA) tablet 500 mg (500 mg Oral Given  10/02/19 0736)  dexamethasone (DECADRON) injection 4 mg (4 mg Intravenous Given 10/02/19 1755)  ceFAZolin (ANCEF) IVPB 2g/100 mL premix (has no administration in time range)  dexamethasone (DECADRON) injection 10 mg (10 mg Intravenous Given 10/02/19 0204)  gadobutrol (GADAVIST) 1 MMOL/ML injection 10 mL (10 mLs Intravenous Contrast Given 10/02/19 0725)    Mobility walks with person assist High fall risk   Focused Assessments Neuro Assessment Handoff:  Swallow screen pass? yes   NIH Stroke Scale ( + Modified Stroke Scale Criteria)  Interval: Initial Level of Consciousness (1a.)   : Alert, keenly responsive LOC Questions (1b. )   +: Answers both questions correctly LOC Commands (1c. )   + : Performs both tasks correctly Best Gaze (2. )  +: Normal Visual (3. )  +: No visual loss Facial Palsy (4. )    : Normal symmetrical movements Motor Arm, Left (5a. )   +: No drift Motor Arm, Right (5b. )   +: No drift Motor Leg, Left (6a. )   +: No drift Motor Leg, Right (6b. )   +: No drift Limb Ataxia (7. ): Absent Sensory (8. )   +: Normal, no sensory loss Best Language (9. )   +: No aphasia Dysarthria (10. ): Mild-to-moderate dysarthria, patient slurs at least some words and, at worst, can be understood with some difficulty Extinction/Inattention (11.)   +: No Abnormality Modified SS Total  +: 0 Complete NIHSS TOTAL: 1 Last date known well: 09/23/19 Last time known well: 0800 Neuro Assessment: Within Defined Limits Neuro Checks:   Initial (09/17/2019 2356)  Last Documented NIHSS Modified Score: 0 (10/02/19 0742) Has TPA been given? Yes Temp: 97.9 F (36.6 C) (04/28 1932) Temp Source: Oral (04/28 1932) BP: 165/91 (04/28 2235) Pulse Rate: 90 (04/28 2235) If patient is a Neuro Trauma and patient is going to OR before floor call report to [place] nurse: [phone] or [phone]     R Recommendations: See Admitting Provider Note  Report given to: Chrissie Noa RN  Additional Notes:

## 2019-10-02 NOTE — ED Notes (Signed)
Patient was in the hall walking back from the bathroom by herself went to help patient back to her room.  Patient stated that she wants to go home and wants to talk to the MD because she does that want to have surgery.  I talked the patient into getting back in the bed and made the nurse aware.

## 2019-10-03 ENCOUNTER — Encounter (HOSPITAL_COMMUNITY): Payer: Self-pay | Admitting: Certified Registered"

## 2019-10-03 ENCOUNTER — Encounter (HOSPITAL_COMMUNITY): Payer: Self-pay | Admitting: Neurosurgery

## 2019-10-03 ENCOUNTER — Encounter (HOSPITAL_COMMUNITY): Admission: EM | Disposition: E | Payer: Self-pay | Source: Home / Self Care | Attending: Neurosurgery

## 2019-10-03 LAB — SURGICAL PCR SCREEN
MRSA, PCR: NEGATIVE
Staphylococcus aureus: NEGATIVE

## 2019-10-03 LAB — ABO/RH: ABO/RH(D): A POS

## 2019-10-03 SURGERY — CRANIOTOMY TUMOR EXCISION
Anesthesia: General | Laterality: Right

## 2019-10-03 MED ORDER — ROCURONIUM BROMIDE 10 MG/ML (PF) SYRINGE
PREFILLED_SYRINGE | INTRAVENOUS | Status: AC
  Filled 2019-10-03: qty 20

## 2019-10-03 MED ORDER — FENTANYL CITRATE (PF) 250 MCG/5ML IJ SOLN
INTRAMUSCULAR | Status: AC
  Filled 2019-10-03: qty 5

## 2019-10-03 MED ORDER — EPHEDRINE 5 MG/ML INJ
INTRAVENOUS | Status: AC
  Filled 2019-10-03: qty 10

## 2019-10-03 MED ORDER — PROPOFOL 10 MG/ML IV BOLUS
INTRAVENOUS | Status: AC
  Filled 2019-10-03: qty 20

## 2019-10-03 MED ORDER — LIDOCAINE 2% (20 MG/ML) 5 ML SYRINGE
INTRAMUSCULAR | Status: AC
  Filled 2019-10-03: qty 10

## 2019-10-03 MED ORDER — CHLORHEXIDINE GLUCONATE CLOTH 2 % EX PADS
6.0000 | MEDICATED_PAD | Freq: Every day | CUTANEOUS | Status: DC
  Administered 2019-10-03 – 2019-10-23 (×19): 6 via TOPICAL

## 2019-10-03 MED ORDER — PHENYLEPHRINE 40 MCG/ML (10ML) SYRINGE FOR IV PUSH (FOR BLOOD PRESSURE SUPPORT)
PREFILLED_SYRINGE | INTRAVENOUS | Status: AC
  Filled 2019-10-03: qty 10

## 2019-10-03 NOTE — Progress Notes (Addendum)
  NEUROSURGERY PROGRESS NOTE   No issues overnight, transferred from [facility] early this am. Pt initially presenting with several months of progressively worsening confusion and gait instability confirmed by her son. She is a smoker, no significant past medical hx.  Had half a chicken biscuit from [employer] about 30 min ago.  EXAM:  BP 121/68 (BP Location: Left Arm)   Pulse 66   Temp 97.7 F (36.5 C) (Oral)   Resp 18   Ht 5\' 5"  (1.651 m)   Wt 117 kg   SpO2 96%   BMI 42.92 kg/m   Awake, alert, oriented  Speech fluent CN grossly intact  5/5 BUE/BLE   IMAGING: MRI brain w/w/o contrast reviewed demonstrating homogenously enhancing right frontal dural based lesion with significant surrounding frontal edema and effacement of the lateral ventricle, slight R->L MLS.  IMPRESSION:  64 y.o. female presenting with confusion related to large right frontal likely meningioma with significant surrounding edema.  PLAN: - Will need to reschedule surgery after pt ate this am - Scheduled for Tuesday 5/4 am. - Cont dexamethasone - Can be OOB with assistance as she is a fall risk.  I have reviewed the imaging with the patient and her son at bedside. We discussed the likely dx of meningioma and treatment options including my recommendation for surgical resection. We discussed expected postoperative course and recovery. Risks of surgery were reviewed to include stroke leading to coma/death/weakness/paralysis, life threatening bleeding, infection, SZ, CSF leak, and potential need for additional surgeries. We discussed the need for continued surveillance and possibility of tumor recurrence. All their questions today were answered and they provided consent to proceed with surgery.

## 2019-10-03 NOTE — Plan of Care (Signed)

## 2019-10-03 NOTE — Progress Notes (Signed)
1 bottle of simvastatin sent to pharm, form in pt chart

## 2019-10-03 NOTE — ED Notes (Signed)
[facility] picking up patient and taking to [place] 

## 2019-10-04 LAB — ABO/RH: ABO/RH(D): A POS

## 2019-10-04 LAB — TYPE AND SCREEN
ABO/RH(D): A POS
Antibody Screen: NEGATIVE

## 2019-10-04 NOTE — Progress Notes (Signed)
  NEUROSURGERY PROGRESS NOTE   No issues overnight. Pt without new complaint this am, no HA.  EXAM:  BP (!) 160/83 (BP Location: Right Arm)   Pulse (!) 50   Temp 97.9 F (36.6 C) (Oral)   Resp 18   Ht 5\' 5"  (1.651 m)   Wt 117 kg   SpO2 97%   BMI 42.92 kg/m   Awake, alert, oriented  Speech fluent, appropriate  CN grossly intact  5/5 BUE/BLE   IMPRESSION:  64 y.o. female with right frontal meningioma and significant surrounding edema, planned surgery on Tuesday.  PLAN: - Cont dexamethasone - OOB with assist - Surgery next week

## 2019-10-05 NOTE — Progress Notes (Signed)
Subjective: The patient is alert and pleasant.  She is in no apparent distress.  She complains of a headache.  Objective: Vital signs in last 24 hours: Temp:  [97.5 F (36.4 C)-98.4 F (36.9 C)] 97.8 F (36.6 C) (05/01 0800) Pulse Rate:  [44-80] 51 (05/01 0800) Resp:  [16-18] 16 (05/01 0800) BP: (115-162)/(63-97) 162/78 (05/01 0800) SpO2:  [96 %-100 %] 98 % (05/01 0800) Estimated body mass index is 42.92 kg/m as calculated from the following:   Height as of this encounter: 5\' 5"  (1.651 m).   Weight as of this encounter: 117 kg.   Intake/Output from previous day: 04/30 0701 - 05/01 0700 In: 320 [P.O.:320] Out: -  Intake/Output this shift: No intake/output data recorded.  Physical exam the patient is alert and pleasant.  She is moving all 4 extremities well.  Lab Results: No results for input(s): WBC, HGB, HCT, PLT in the last 72 hours. BMET No results for input(s): NA, K, CL, CO2, GLUCOSE, BUN, CREATININE, CALCIUM in the last 72 hours.  Studies/Results: No results found.  Assessment/Plan: Right frontal brain tumor: The plan is for surgery on Tuesday.  LOS: 3 days     Ophelia Charter 10/05/2019, 8:35 AM

## 2019-10-05 DEATH — deceased

## 2019-10-06 LAB — HEMOGLOBIN A1C
Hgb A1c MFr Bld: 6.3 % — ABNORMAL HIGH (ref 4.8–5.6)
Mean Plasma Glucose: 134.11 mg/dL

## 2019-10-06 LAB — GLUCOSE, CAPILLARY
Glucose-Capillary: 136 mg/dL — ABNORMAL HIGH (ref 70–99)
Glucose-Capillary: 108 mg/dL — ABNORMAL HIGH (ref 70–99)
Glucose-Capillary: 137 mg/dL — ABNORMAL HIGH (ref 70–99)

## 2019-10-06 MED ORDER — INSULIN ASPART 100 UNIT/ML ~~LOC~~ SOLN
0.0000 [IU] | Freq: Three times a day (TID) | SUBCUTANEOUS | Status: DC
  Administered 2019-10-06: 2 [IU] via SUBCUTANEOUS
  Administered 2019-10-08 (×2): 3 [IU] via SUBCUTANEOUS

## 2019-10-06 MED ORDER — HEPARIN SODIUM (PORCINE) 5000 UNIT/ML IJ SOLN
5000.0000 [IU] | Freq: Three times a day (TID) | INTRAMUSCULAR | Status: DC
  Administered 2019-10-06 – 2019-10-17 (×34): 5000 [IU] via SUBCUTANEOUS
  Filled 2019-10-06 (×34): qty 1

## 2019-10-06 NOTE — Progress Notes (Signed)
Neurosurgery Service Progress Note  Subjective: No acute events overnight, complaining of continued mild holocephalic headache, no N/V   Objective: Vitals:   10/05/19 1947 10/06/19 0033 10/06/19 0349 10/06/19 0758  BP: 124/63 (!) 158/80 (!) 160/97 130/72  Pulse: (!) 51 (!) 54 (!) 46 (!) 47  Resp: 16 16 16 18   Temp: 98.2 F (36.8 C) 97.7 F (36.5 C) (!) 97.4 F (36.3 C) 98 F (36.7 C)  TempSrc: Oral Oral Oral Oral  SpO2: 99% 99% 97% 99%  Weight:      Height:       Temp (24hrs), Avg:97.8 F (36.6 C), Min:97.4 F (36.3 C), Max:98.2 F (36.8 C)  CBC Latest Ref Rng & Units 09/23/2019 10/18/2018 09/10/2015  WBC 4.0 - 10.5 K/uL 6.8 5.9 9.7  Hemoglobin 12.0 - 15.0 g/dL 15.6(H) 14.5 15.4(H)  Hematocrit 36.0 - 46.0 % 48.8(H) 45.0 46.1(H)  Platelets 150 - 400 K/uL 302 255 306   BMP Latest Ref Rng & Units 09/23/2019 10/18/2018 09/10/2015  Glucose 70 - 99 mg/dL 91 88 103(H)  BUN 8 - 23 mg/dL 10 15 16   Creatinine 0.44 - 1.00 mg/dL 0.65 0.68 0.86  Sodium 135 - 145 mmol/L 137 140 138  Potassium 3.5 - 5.1 mmol/L 4.0 4.0 4.1  Chloride 98 - 111 mmol/L 106 106 106  CO2 22 - 32 mmol/L 23 27 23   Calcium 8.9 - 10.3 mg/dL 9.3 8.8(L) 9.3    Intake/Output Summary (Last 24 hours) at 10/06/2019 1110 Last data filed at 10/05/2019 2300 Gross per 24 hour  Intake 5595.87 ml  Output 200 ml  Net 5395.87 ml    Current Facility-Administered Medications:  .  0.9 %  sodium chloride infusion, , Intravenous, Continuous, Costella, Vista Mink, PA-C, Last Rate: 100 mL/hr at 10/05/19 1722, New Bag at 10/05/19 1722 .  acetaminophen (TYLENOL) tablet 650 mg, 650 mg, Oral, Q6H PRN, 650 mg at 10/06/19 0959 **OR** acetaminophen (TYLENOL) suppository 650 mg, 650 mg, Rectal, Q6H PRN, Costella, Vincent J, PA-C .  bisacodyl (DULCOLAX) suppository 10 mg, 10 mg, Rectal, Daily PRN, Costella, Vincent J, PA-C .  ceFAZolin (ANCEF) IVPB 2g/100 mL premix, 2 g, Intravenous, 30 min Pre-Op, Costella, Vista Mink, PA-C .  Chlorhexidine  Gluconate Cloth 2 % PADS 6 each, 6 each, Topical, Q0600, Consuella Lose, MD, 6 each at 10/06/19 [phone] .  dexamethasone (DECADRON) injection 4 mg, 4 mg, Intravenous, Q6H, Costella, Vincent J, PA-C, 4 mg at 10/06/19 0616 .  diphenhydrAMINE (BENADRYL) capsule 25 mg, 25 mg, Oral, Q6H PRN, Costella, Vincent J, PA-C .  docusate sodium (COLACE) capsule 100 mg, 100 mg, Oral, BID, Costella, Vincent J, PA-C, 100 mg at 10/06/19 0941 .  HYDROmorphone (DILAUDID) injection 0.5-1 mg, 0.5-1 mg, Intravenous, Q2H PRN, Costella, Vincent J, PA-C, 1 mg at 10/02/19 2314 .  levETIRAcetam (KEPPRA) tablet 500 mg, 500 mg, Oral, BID, Costella, Vincent J, PA-C, 500 mg at 10/06/19 0941 .  methocarbamol (ROBAXIN) 500 mg in dextrose 5 % 50 mL IVPB, 500 mg, Intravenous, Q6H PRN, Costella, Vincent J, PA-C .  montelukast (SINGULAIR) tablet 10 mg, 10 mg, Oral, QHS, Costella, Vincent J, PA-C, 10 mg at 10/05/19 2155 .  multivitamin with minerals tablet 1 tablet, 1 tablet, Oral, Daily, Costella, Vista Mink, PA-C, 1 tablet at 10/06/19 0941 .  ondansetron (ZOFRAN) tablet 4 mg, 4 mg, Oral, Q6H PRN **OR** ondansetron (ZOFRAN) injection 4 mg, 4 mg, Intravenous, Q6H PRN, Costella, Vincent J, PA-C, 4 mg at 10/02/19 2314 .  oxyCODONE (Oxy IR/ROXICODONE) immediate release  tablet 5 mg, 5 mg, Oral, Q4H PRN, Costella, Vincent J, PA-C, 5 mg at 10/06/19 0959 .  polyethylene glycol (MIRALAX / GLYCOLAX) packet 17 g, 17 g, Oral, Daily PRN, Costella, Vincent J, PA-C, 17 g at 10/06/19 0959 .  simvastatin (ZOCOR) tablet 10 mg, 10 mg, Oral, Daily, Costella, Vincent J, PA-C, 10 mg at 10/06/19 0940 .  sodium chloride flush (NS) 0.9 % injection 3 mL, 3 mL, Intravenous, Once, Costella, Vincent J, PA-C .  zolpidem (AMBIEN) tablet 5 mg, 5 mg, Oral, QHS PRN, Costella, Vista Mink, PA-C, 5 mg at 10/05/19 2155   Physical Exam: Awake, FCx4, SILTx4  Assessment & Plan: 64 y.o. woman with meningioma, planned resection next week, no issues overnight.   -OR  Tuesday -cont dex 4q6, start ISS -activity OOB with assistance -SCDs/TEDs/SQH  Marcello Moores A Deshannon Seide  10/06/19 11:10 AM

## 2019-10-07 LAB — GLUCOSE, CAPILLARY
Glucose-Capillary: 109 mg/dL — ABNORMAL HIGH (ref 70–99)
Glucose-Capillary: 146 mg/dL — ABNORMAL HIGH (ref 70–99)
Glucose-Capillary: 112 mg/dL — ABNORMAL HIGH (ref 70–99)
Glucose-Capillary: 106 mg/dL — ABNORMAL HIGH (ref 70–99)

## 2019-10-07 NOTE — Anesthesia Preprocedure Evaluation (Addendum)
Anesthesia Evaluation  Patient identified by MRN, date of birth, ID band Patient unresponsive    Reviewed: Allergy & Precautions, Patient's Chart, lab work & pertinent test resultsPreop documentation limited or incomplete due to emergent nature of procedure.  Airway Mallampati: Intubated       Dental   Pulmonary COPD, Current Smoker,    breath sounds clear to auscultation       Cardiovascular negative cardio ROS   Rhythm:Regular Rate:Normal     Neuro/Psych    GI/Hepatic negative GI ROS, Neg liver ROS,   Endo/Other  diabetesMorbid obesity  Renal/GU negative Renal ROS     Musculoskeletal negative musculoskeletal ROS (+)   Abdominal (+) + obese,   Peds  Hematology negative hematology ROS (+)   Anesthesia Other Findings Day of surgery medications reviewed with the patient.  Reproductive/Obstetrics                            Anesthesia Physical Anesthesia Plan  ASA: IV and emergent  Anesthesia Plan: General   Post-op Pain Management:    Induction: Inhalational  PONV Risk Score and Plan: 3 and Ondansetron, Dexamethasone and Treatment may vary due to age or medical condition  Airway Management Planned: Oral ETT  Additional Equipment: Arterial line and CVP  Intra-op Plan:   Post-operative Plan: Post-operative intubation/ventilation  Informed Consent: I have reviewed the patients History and Physical, chart, labs and discussed the procedure including the risks, benefits and alternatives for the proposed anesthesia with the patient or authorized representative who has indicated his/her understanding and acceptance.     Dental advisory given  Plan Discussed with: CRNA  Anesthesia Plan Comments:        Anesthesia Quick Evaluation

## 2019-10-07 NOTE — Progress Notes (Signed)
  NEUROSURGERY PROGRESS NOTE   No issues overnight. Has unchanged HA this am, otherwise no c/o.  EXAM:  BP (!) 175/95 (BP Location: Left Arm)   Pulse (!) 47   Temp 98.2 F (36.8 C) (Oral)   Resp 18   Ht 5\' 5"  (1.651 m)   Wt 117 kg   SpO2 94%   BMI 42.92 kg/m   Awake, alert, oriented  Speech fluent, appropriate  CN grossly intact  MAE well  IMPRESSION:  64 y.o. female with large right frontal meningioma with significant edema, for surgery tomorrow.  PLAN: - NPO p MN - Cont to ambulate with assist - Cont dex

## 2019-10-08 ENCOUNTER — Inpatient Hospital Stay (HOSPITAL_COMMUNITY): Admission: EM | Disposition: E | Payer: Self-pay | Source: Home / Self Care | Attending: Neurosurgery

## 2019-10-08 ENCOUNTER — Inpatient Hospital Stay (HOSPITAL_COMMUNITY): Payer: Self-pay

## 2019-10-08 ENCOUNTER — Inpatient Hospital Stay (HOSPITAL_COMMUNITY): Payer: Self-pay | Admitting: Anesthesiology

## 2019-10-08 DIAGNOSIS — G932 Benign intracranial hypertension: Secondary | ICD-10-CM

## 2019-10-08 DIAGNOSIS — D332 Benign neoplasm of brain, unspecified: Secondary | ICD-10-CM

## 2019-10-08 DIAGNOSIS — R578 Other shock: Secondary | ICD-10-CM

## 2019-10-08 DIAGNOSIS — J9601 Acute respiratory failure with hypoxia: Secondary | ICD-10-CM

## 2019-10-08 DIAGNOSIS — Z01818 Encounter for other preprocedural examination: Secondary | ICD-10-CM

## 2019-10-08 HISTORY — PX: CRANIOTOMY: SHX93

## 2019-10-08 LAB — CBC
HCT: 49.2 % — ABNORMAL HIGH (ref 36.0–46.0)
Hemoglobin: 16.6 g/dL — ABNORMAL HIGH (ref 12.0–15.0)
MCH: 28.9 pg (ref 26.0–34.0)
MCHC: 33.7 g/dL (ref 30.0–36.0)
MCV: 85.6 fL (ref 80.0–100.0)
Platelets: 306 10*3/uL (ref 150–400)
RBC: 5.75 MIL/uL — ABNORMAL HIGH (ref 3.87–5.11)
RDW: 13.8 % (ref 11.5–15.5)
WBC: 12.5 10*3/uL — ABNORMAL HIGH (ref 4.0–10.5)
nRBC: 0 % (ref 0.0–0.2)

## 2019-10-08 LAB — POCT I-STAT 7, (LYTES, BLD GAS, ICA,H+H)
Acid-Base Excess: 4 mmol/L — ABNORMAL HIGH (ref 0.0–2.0)
Bicarbonate: 27.7 mmol/L (ref 20.0–28.0)
Calcium, Ion: 1.08 mmol/L — ABNORMAL LOW (ref 1.15–1.40)
HCT: 47 % — ABNORMAL HIGH (ref 36.0–46.0)
Hemoglobin: 16 g/dL — ABNORMAL HIGH (ref 12.0–15.0)
O2 Saturation: 100 %
Potassium: 3.2 mmol/L — ABNORMAL LOW (ref 3.5–5.1)
Sodium: 140 mmol/L (ref 135–145)
TCO2: 29 mmol/L (ref 22–32)
pCO2 arterial: 37.1 mmHg (ref 32.0–48.0)
pH, Arterial: 7.482 — ABNORMAL HIGH (ref 7.350–7.450)
pO2, Arterial: 385 mmHg — ABNORMAL HIGH (ref 83.0–108.0)

## 2019-10-08 LAB — BASIC METABOLIC PANEL
Anion gap: 11 (ref 5–15)
BUN: 19 mg/dL (ref 8–23)
CO2: 25 mmol/L (ref 22–32)
Calcium: 8.9 mg/dL (ref 8.9–10.3)
Chloride: 102 mmol/L (ref 98–111)
Creatinine, Ser: 0.81 mg/dL (ref 0.44–1.00)
GFR calc Af Amer: >60 mL/min (ref >60)
GFR calc non Af Amer: >60 mL/min (ref >60)
Glucose, Bld: 117 mg/dL — ABNORMAL HIGH (ref 70–99)
Potassium: 3.8 mmol/L (ref 3.5–5.1)
Sodium: 138 mmol/L (ref 135–145)

## 2019-10-08 LAB — SODIUM
Sodium: 138 mmol/L (ref 135–145)
Sodium: 140 mmol/L (ref 135–145)

## 2019-10-08 LAB — GLUCOSE, CAPILLARY
Glucose-Capillary: 114 mg/dL — ABNORMAL HIGH (ref 70–99)
Glucose-Capillary: 168 mg/dL — ABNORMAL HIGH (ref 70–99)
Glucose-Capillary: 190 mg/dL — ABNORMAL HIGH (ref 70–99)
Glucose-Capillary: 179 mg/dL — ABNORMAL HIGH (ref 70–99)
Glucose-Capillary: 153 mg/dL — ABNORMAL HIGH (ref 70–99)

## 2019-10-08 LAB — MRSA PCR SCREENING: MRSA by PCR: NEGATIVE

## 2019-10-08 LAB — SODIUM, URINE, RANDOM: Sodium, Ur: 184 mmol/L

## 2019-10-08 LAB — OSMOLALITY, URINE: Osmolality, Ur: 520 mOsm/kg (ref 300–900)

## 2019-10-08 LAB — PREPARE RBC (CROSSMATCH)

## 2019-10-08 SURGERY — CRANIOTOMY TUMOR EXCISION
Anesthesia: General | Site: Head | Laterality: Right

## 2019-10-08 MED ORDER — SODIUM CHLORIDE 0.9% FLUSH: 10.0000 mL | INTRAVENOUS | Status: DC | PRN

## 2019-10-08 MED ORDER — BUPIVACAINE HCL (PF) 0.5 % IJ SOLN
INTRAMUSCULAR | Status: AC
  Filled 2019-10-08: qty 30

## 2019-10-08 MED ORDER — SODIUM CHLORIDE 0.9 % IV BOLUS
500.0000 mL | Freq: Once | INTRAVENOUS | Status: AC
  Administered 2019-10-08: 500 mL via INTRAVENOUS

## 2019-10-08 MED ORDER — THROMBIN 5000 UNITS EX SOLR
OROMUCOSAL | Status: DC | PRN
  Administered 2019-10-08 (×2): 5 mL via TOPICAL

## 2019-10-08 MED ORDER — PHENYLEPHRINE HCL (PRESSORS) 10 MG/ML IV SOLN
INTRAVENOUS | Status: DC | PRN
Start: 2019-10-08 — End: 2019-10-08
  Administered 2019-10-08: 120 ug via INTRAVENOUS

## 2019-10-08 MED ORDER — MANNITOL 25 % IV SOLN: 25.0000 g | Freq: Once | Status: AC

## 2019-10-08 MED ORDER — PHENYLEPHRINE HCL-NACL 10-0.9 MG/250ML-% IV SOLN
INTRAVENOUS | Status: AC
  Filled 2019-10-08: qty 250

## 2019-10-08 MED ORDER — POTASSIUM CHLORIDE 10 MEQ/100ML IV SOLN
10.0000 meq | INTRAVENOUS | Status: AC
  Administered 2019-10-08 (×4): 10 meq via INTRAVENOUS
  Filled 2019-10-08 (×4): qty 100

## 2019-10-08 MED ORDER — INSULIN ASPART 100 UNIT/ML ~~LOC~~ SOLN
0.0000 [IU] | SUBCUTANEOUS | Status: DC
  Administered 2019-10-08: 3 [IU] via SUBCUTANEOUS
  Administered 2019-10-09 (×3): 2 [IU] via SUBCUTANEOUS
  Administered 2019-10-09: 3 [IU] via SUBCUTANEOUS
  Administered 2019-10-09 – 2019-10-10 (×2): 2 [IU] via SUBCUTANEOUS

## 2019-10-08 MED ORDER — PHENYLEPHRINE 40 MCG/ML (10ML) SYRINGE FOR IV PUSH (FOR BLOOD PRESSURE SUPPORT)
PREFILLED_SYRINGE | INTRAVENOUS | Status: AC
  Filled 2019-10-08: qty 20

## 2019-10-08 MED ORDER — SODIUM CHLORIDE 0.9% FLUSH
10.0000 mL | Freq: Two times a day (BID) | INTRAVENOUS | Status: DC
  Administered 2019-10-08 – 2019-10-13 (×10): 10 mL
  Administered 2019-10-13: 20 mL
  Administered 2019-10-14 – 2019-10-15 (×4): 10 mL
  Administered 2019-10-16: 20 mL
  Administered 2019-10-16 – 2019-10-18 (×3): 10 mL

## 2019-10-08 MED ORDER — PROPOFOL 1000 MG/100ML IV EMUL
0.0000 ug/kg/min | INTRAVENOUS | Status: DC
  Administered 2019-10-08: 50 ug/kg/min via INTRAVENOUS
  Administered 2019-10-08: 100 mg via INTRAVENOUS

## 2019-10-08 MED ORDER — FENTANYL CITRATE (PF) 250 MCG/5ML IJ SOLN
INTRAMUSCULAR | Status: AC
  Filled 2019-10-08: qty 5

## 2019-10-08 MED ORDER — NALOXONE HCL 0.4 MG/ML IJ SOLN
INTRAMUSCULAR | Status: AC
  Filled 2019-10-08: qty 1

## 2019-10-08 MED ORDER — PHENYLEPHRINE 40 MCG/ML (10ML) SYRINGE FOR IV PUSH (FOR BLOOD PRESSURE SUPPORT)
PREFILLED_SYRINGE | INTRAVENOUS | Status: AC
  Administered 2019-10-08: 400 ug
  Filled 2019-10-08: qty 10

## 2019-10-08 MED ORDER — FENTANYL CITRATE (PF) 100 MCG/2ML IJ SOLN: 50.0000 ug | INTRAMUSCULAR | Status: DC | PRN

## 2019-10-08 MED ORDER — MONTELUKAST SODIUM 10 MG PO TABS
10.0000 mg | ORAL_TABLET | Freq: Every day | ORAL | Status: DC
  Administered 2019-10-08 – 2019-10-09 (×2): 10 mg
  Filled 2019-10-08 (×2): qty 1

## 2019-10-08 MED ORDER — LIDOCAINE-EPINEPHRINE 1 %-1:100000 IJ SOLN
INTRAMUSCULAR | Status: AC
  Filled 2019-10-08: qty 1

## 2019-10-08 MED ORDER — THROMBIN 5000 UNITS EX SOLR
CUTANEOUS | Status: AC
  Filled 2019-10-08: qty 5000

## 2019-10-08 MED ORDER — DOCUSATE SODIUM 50 MG/5ML PO LIQD
100.0000 mg | Freq: Two times a day (BID) | ORAL | Status: DC
  Administered 2019-10-08 – 2019-10-09 (×3): 100 mg
  Filled 2019-10-08 (×2): qty 10

## 2019-10-08 MED ORDER — ROCURONIUM BROMIDE 10 MG/ML (PF) SYRINGE
PREFILLED_SYRINGE | INTRAVENOUS | Status: AC
  Filled 2019-10-08: qty 30

## 2019-10-08 MED ORDER — SODIUM CHLORIDE 3 % IV SOLN: INTRAVENOUS | Status: DC

## 2019-10-08 MED ORDER — FENTANYL CITRATE (PF) 100 MCG/2ML IJ SOLN
50.0000 ug | INTRAMUSCULAR | Status: DC | PRN
  Administered 2019-10-08 – 2019-10-10 (×2): 50 ug via INTRAVENOUS
  Filled 2019-10-08: qty 2

## 2019-10-08 MED ORDER — NOREPINEPHRINE 4 MG/250ML-% IV SOLN: 0.0000 ug/min | INTRAVENOUS | Status: DC

## 2019-10-08 MED ORDER — DEXAMETHASONE SODIUM PHOSPHATE 10 MG/ML IJ SOLN
INTRAMUSCULAR | Status: DC | PRN
  Administered 2019-10-08: 10 mg via INTRAVENOUS

## 2019-10-08 MED ORDER — FENTANYL CITRATE (PF) 100 MCG/2ML IJ SOLN
INTRAMUSCULAR | Status: DC | PRN
  Administered 2019-10-08 (×2): 50 ug via INTRAVENOUS

## 2019-10-08 MED ORDER — DEXAMETHASONE SODIUM PHOSPHATE 10 MG/ML IJ SOLN
INTRAMUSCULAR | Status: AC
  Filled 2019-10-08: qty 1

## 2019-10-08 MED ORDER — NALOXONE HCL 0.4 MG/ML IJ SOLN
INTRAMUSCULAR | Status: AC
  Administered 2019-10-08: 0.4 mg via INTRAVENOUS
  Filled 2019-10-08: qty 1

## 2019-10-08 MED ORDER — ORAL CARE MOUTH RINSE
15.0000 mL | OROMUCOSAL | Status: DC
  Administered 2019-10-08 – 2019-10-23 (×146): 15 mL via OROMUCOSAL

## 2019-10-08 MED ORDER — ONDANSETRON HCL 4 MG/2ML IJ SOLN
INTRAMUSCULAR | Status: DC | PRN
  Administered 2019-10-08: 4 mg via INTRAVENOUS

## 2019-10-08 MED ORDER — ROCURONIUM BROMIDE 100 MG/10ML IV SOLN
INTRAVENOUS | Status: DC | PRN
  Administered 2019-10-08: 60 mg via INTRAVENOUS
  Administered 2019-10-08: 40 mg via INTRAVENOUS

## 2019-10-08 MED ORDER — PANTOPRAZOLE SODIUM 40 MG IV SOLR
40.0000 mg | Freq: Every day | INTRAVENOUS | Status: DC
  Administered 2019-10-08 – 2019-10-11 (×4): 40 mg via INTRAVENOUS
  Filled 2019-10-08 (×4): qty 40

## 2019-10-08 MED ORDER — THROMBIN 20000 UNITS EX SOLR
CUTANEOUS | Status: AC
  Filled 2019-10-08: qty 20000

## 2019-10-08 MED ORDER — SODIUM CHLORIDE 23.4 % INJECTION (4 MEQ/ML) FOR IV ADMINISTRATION
120.0000 meq | Freq: Once | INTRAVENOUS | Status: AC
  Administered 2019-10-08: 120 meq via INTRAVENOUS
  Filled 2019-10-08: qty 30

## 2019-10-08 MED ORDER — MIDAZOLAM HCL 2 MG/2ML IJ SOLN
2.0000 mg | Freq: Once | INTRAMUSCULAR | Status: AC
  Administered 2019-10-08: 2 mg via INTRAVENOUS

## 2019-10-08 MED ORDER — NOREPINEPHRINE 4 MG/250ML-% IV SOLN
INTRAVENOUS | Status: AC
  Administered 2019-10-08: 40 ug/min via INTRAVENOUS
  Filled 2019-10-08: qty 250

## 2019-10-08 MED ORDER — SODIUM CHLORIDE 3 % IV SOLN
INTRAVENOUS | Status: AC
  Administered 2019-10-08 – 2019-10-10 (×6): 75 mL/h via INTRAVENOUS
  Filled 2019-10-08 (×9): qty 500

## 2019-10-08 MED ORDER — NALOXONE HCL 0.4 MG/ML IJ SOLN: 0.4000 mg | INTRAMUSCULAR | Status: DC | PRN

## 2019-10-08 MED ORDER — CEFAZOLIN SODIUM 1 G IJ SOLR
INTRAMUSCULAR | Status: AC
  Filled 2019-10-08: qty 20

## 2019-10-08 MED ORDER — LEVETIRACETAM 100 MG/ML PO SOLN
500.0000 mg | Freq: Two times a day (BID) | ORAL | Status: DC
  Administered 2019-10-08 – 2019-10-23 (×30): 500 mg
  Filled 2019-10-08 (×30): qty 5

## 2019-10-08 MED ORDER — SODIUM CHLORIDE 0.9 % IV SOLN
INTRAVENOUS | Status: DC | PRN
  Administered 2019-10-08: 500 mL

## 2019-10-08 MED ORDER — PHENYLEPHRINE HCL-NACL 10-0.9 MG/250ML-% IV SOLN
INTRAVENOUS | Status: DC | PRN
  Administered 2019-10-08: 40 ug/min via INTRAVENOUS

## 2019-10-08 MED ORDER — DOCUSATE SODIUM 50 MG/5ML PO LIQD
100.0000 mg | Freq: Two times a day (BID) | ORAL | Status: DC
  Filled 2019-10-08: qty 10

## 2019-10-08 MED ORDER — BACITRACIN ZINC 500 UNIT/GM EX OINT
TOPICAL_OINTMENT | CUTANEOUS | Status: AC
  Filled 2019-10-08: qty 28.35

## 2019-10-08 MED ORDER — CHLORHEXIDINE GLUCONATE 0.12% ORAL RINSE (MEDLINE KIT)
15.0000 mL | Freq: Two times a day (BID) | OROMUCOSAL | Status: DC
  Administered 2019-10-08 – 2019-10-23 (×31): 15 mL via OROMUCOSAL

## 2019-10-08 MED ORDER — SODIUM CHLORIDE 0.9% IV SOLUTION: Freq: Once | INTRAVENOUS | Status: AC

## 2019-10-08 MED ORDER — ONDANSETRON HCL 4 MG/2ML IJ SOLN
INTRAMUSCULAR | Status: AC
  Filled 2019-10-08: qty 2

## 2019-10-08 MED ORDER — 0.9 % SODIUM CHLORIDE (POUR BTL) OPTIME
TOPICAL | Status: DC | PRN
  Administered 2019-10-08 (×3): 1000 mL

## 2019-10-08 MED ORDER — THROMBIN 20000 UNITS EX SOLR
CUTANEOUS | Status: DC | PRN
  Administered 2019-10-08: 10:00:00 20 mL via TOPICAL

## 2019-10-08 MED ORDER — MANNITOL 25 % IV SOLN
INTRAVENOUS | Status: AC
  Administered 2019-10-08: 12.5 g
  Filled 2019-10-08: qty 50

## 2019-10-08 MED ORDER — MANNITOL 25 % IV SOLN
INTRAVENOUS | Status: AC
  Administered 2019-10-08: 12.5 g
  Filled 2019-10-08: qty 100

## 2019-10-08 MED ORDER — ACETAMINOPHEN 160 MG/5ML PO SOLN
650.0000 mg | Freq: Four times a day (QID) | ORAL | Status: DC | PRN
  Administered 2019-10-23: 650 mg
  Filled 2019-10-08: qty 20.3

## 2019-10-08 MED ORDER — MICROFIBRILLAR COLL HEMOSTAT EX PADS
MEDICATED_PAD | CUTANEOUS | Status: DC | PRN
  Administered 2019-10-08: 1 via TOPICAL

## 2019-10-08 MED ORDER — ETOMIDATE 2 MG/ML IV SOLN
20.0000 mg | Freq: Once | INTRAVENOUS | Status: AC
  Administered 2019-10-08: 20 mg via INTRAVENOUS

## 2019-10-08 MED ORDER — BACITRACIN ZINC 500 UNIT/GM EX OINT
TOPICAL_OINTMENT | CUTANEOUS | Status: DC | PRN
  Administered 2019-10-08: 1 via TOPICAL

## 2019-10-08 MED ORDER — PROPOFOL 1000 MG/100ML IV EMUL
INTRAVENOUS | Status: AC
  Filled 2019-10-08: qty 100

## 2019-10-08 MED FILL — Medication: Qty: 1 | Status: AC

## 2019-10-08 SURGICAL SUPPLY — 106 items
APL SKNCLS STERI-STRIP NONHPOA (GAUZE/BANDAGES/DRESSINGS)
BAND INSRT 18 STRL LF DISP RB (MISCELLANEOUS)
BAND RUBBER #18 3X1/16 STRL (MISCELLANEOUS) IMPLANT
BENZOIN TINCTURE PRP APPL 2/3 (GAUZE/BANDAGES/DRESSINGS) IMPLANT
BLADE CLIPPER SURG (BLADE) ×4 IMPLANT
BLADE SAW GIGLI 16 STRL (MISCELLANEOUS) IMPLANT
BLADE SURG 15 STRL LF DISP TIS (BLADE) IMPLANT
BLADE SURG 15 STRL SS (BLADE)
BLADE ULTRA TIP 2M (BLADE) ×1 IMPLANT
BNDG CMPR 75X41 PLY HI ABS (GAUZE/BANDAGES/DRESSINGS)
BNDG GAUZE ELAST 4 BULKY (GAUZE/BANDAGES/DRESSINGS) IMPLANT
BNDG STRETCH 4X75 STRL LF (GAUZE/BANDAGES/DRESSINGS) IMPLANT
BUR ACORN 6.0 PRECISION (BURR) ×3 IMPLANT
BUR ACORN 6.0MM PRECISION (BURR) ×1
BUR ROUND FLUTED 4 SOFT TCH (BURR) IMPLANT
BUR ROUND FLUTED 4MM SOFT TCH (BURR)
BUR SPIRAL ROUTER 2.3 (BUR) ×3 IMPLANT
BUR SPIRAL ROUTER 2.3MM (BUR) ×1
BUR TAPERED ROUTER 3.0 (BURR) ×3 IMPLANT
CANISTER SUCT 3000ML PPV (MISCELLANEOUS) ×11 IMPLANT
CARTRIDGE OIL MAESTRO DRILL (MISCELLANEOUS) ×2 IMPLANT
CATH VENTRIC 35X38 W/TROCAR LG (CATHETERS) IMPLANT
CLIP VESOCCLUDE MED 6/CT (CLIP) ×3 IMPLANT
CNTNR URN SCR LID CUP LEK RST (MISCELLANEOUS) ×2 IMPLANT
CONT SPEC 4OZ STRL OR WHT (MISCELLANEOUS) ×4
COVER MAYO STAND STRL (DRAPES) IMPLANT
COVER WAND RF STERILE (DRAPES) ×1 IMPLANT
DECANTER SPIKE VIAL GLASS SM (MISCELLANEOUS) ×1 IMPLANT
DIFFUSER DRILL AIR PNEUMATIC (MISCELLANEOUS) ×4 IMPLANT
DRAIN SUBARACHNOID (WOUND CARE) IMPLANT
DRAPE HALF SHEET 40X57 (DRAPES) ×4 IMPLANT
DRAPE MICROSCOPE LEICA (MISCELLANEOUS) IMPLANT
DRAPE NEUROLOGICAL W/INCISE (DRAPES) ×4 IMPLANT
DRAPE STERI IOBAN 125X83 (DRAPES) ×3 IMPLANT
DRAPE SURG 17X23 STRL (DRAPES) IMPLANT
DRAPE WARM FLUID 44X44 (DRAPES) ×4 IMPLANT
DRSG ADAPTIC 3X8 NADH LF (GAUZE/BANDAGES/DRESSINGS) IMPLANT
DRSG OPSITE POSTOP 4X6 (GAUZE/BANDAGES/DRESSINGS) IMPLANT
DRSG OPSITE POSTOP 4X8 (GAUZE/BANDAGES/DRESSINGS) ×3 IMPLANT
DRSG TELFA 3X8 NADH (GAUZE/BANDAGES/DRESSINGS) ×4 IMPLANT
DURAPREP 6ML APPLICATOR 50/CS (WOUND CARE) ×10 IMPLANT
ELECT REM PT RETURN 9FT ADLT (ELECTROSURGICAL) ×4
ELECTRODE REM PT RTRN 9FT ADLT (ELECTROSURGICAL) ×2 IMPLANT
EVACUATOR 1/8 PVC DRAIN (DRAIN) IMPLANT
EVACUATOR SILICONE 100CC (DRAIN) IMPLANT
FORCEPS BIPOLAR SPETZLER 8 1.0 (NEUROSURGERY SUPPLIES) ×4 IMPLANT
GAUZE 4X4 16PLY RFD (DISPOSABLE) IMPLANT
GAUZE SPONGE 4X4 12PLY STRL (GAUZE/BANDAGES/DRESSINGS) ×7 IMPLANT
GLOVE BIO SURGEON STRL SZ7.5 (GLOVE) ×3 IMPLANT
GLOVE BIOGEL PI IND STRL 7.0 (GLOVE) IMPLANT
GLOVE BIOGEL PI IND STRL 7.5 (GLOVE) ×4 IMPLANT
GLOVE BIOGEL PI INDICATOR 7.0 (GLOVE)
GLOVE BIOGEL PI INDICATOR 7.5 (GLOVE) ×4
GLOVE ECLIPSE 7.0 STRL STRAW (GLOVE) ×8 IMPLANT
GLOVE ECLIPSE 9.0 STRL (GLOVE) ×3 IMPLANT
GLOVE EXAM NITRILE XL STR (GLOVE) IMPLANT
GOWN STRL REUS W/ TWL LRG LVL3 (GOWN DISPOSABLE) ×4 IMPLANT
GOWN STRL REUS W/ TWL XL LVL3 (GOWN DISPOSABLE) ×1 IMPLANT
GOWN STRL REUS W/TWL 2XL LVL3 (GOWN DISPOSABLE) ×3 IMPLANT
GOWN STRL REUS W/TWL LRG LVL3 (GOWN DISPOSABLE) ×8
GOWN STRL REUS W/TWL XL LVL3 (GOWN DISPOSABLE) ×4
GRAFT DURAGEN MATRIX 5WX7L (Graft) ×3 IMPLANT
HEMOSTAT POWDER KIT SURGIFOAM (HEMOSTASIS) ×7 IMPLANT
HEMOSTAT SURGICEL 2X14 (HEMOSTASIS) ×4 IMPLANT
HOOK DURA 1/2IN (MISCELLANEOUS) ×4 IMPLANT
IV NS 1000ML (IV SOLUTION)
IV NS 1000ML BAXH (IV SOLUTION) ×1 IMPLANT
KIT BASIN OR (CUSTOM PROCEDURE TRAY) ×4 IMPLANT
KIT DRAIN CSF ACCUDRAIN (MISCELLANEOUS) IMPLANT
KIT TURNOVER KIT B (KITS) ×4 IMPLANT
KNIFE ARACHNOID DISP AM-24-S (MISCELLANEOUS) ×4 IMPLANT
MARKER SPHERE PSV REFLC 13MM (MARKER) ×8 IMPLANT
NDL SPNL 18GX3.5 QUINCKE PK (NEEDLE) IMPLANT
NEEDLE HYPO 22GX1.5 SAFETY (NEEDLE) ×4 IMPLANT
NEEDLE SPNL 18GX3.5 QUINCKE PK (NEEDLE) IMPLANT
NS IRRIG 1000ML POUR BTL (IV SOLUTION) ×12 IMPLANT
OIL CARTRIDGE MAESTRO DRILL (MISCELLANEOUS) ×4
PACK CRANIOTOMY CUSTOM (CUSTOM PROCEDURE TRAY) ×4 IMPLANT
PAD DRESSING TELFA 3X8 NADH (GAUZE/BANDAGES/DRESSINGS) IMPLANT
PATTIES SURGICAL .25X.25 (GAUZE/BANDAGES/DRESSINGS) IMPLANT
PATTIES SURGICAL .5 X.5 (GAUZE/BANDAGES/DRESSINGS) IMPLANT
PATTIES SURGICAL .5 X3 (DISPOSABLE) IMPLANT
PATTIES SURGICAL 1/4 X 3 (GAUZE/BANDAGES/DRESSINGS) IMPLANT
PATTIES SURGICAL 1X1 (DISPOSABLE) IMPLANT
PIN MAYFIELD SKULL DISP (PIN) ×4 IMPLANT
SPECIMEN JAR SMALL (MISCELLANEOUS) IMPLANT
SPONGE NEURO XRAY DETECT 1X3 (DISPOSABLE) IMPLANT
SPONGE SURGIFOAM ABS GEL 100 (HEMOSTASIS) ×4 IMPLANT
STAPLER VISISTAT 35W (STAPLE) ×10 IMPLANT
STOCKINETTE 6  STRL (DRAPES) ×4
STOCKINETTE 6 STRL (DRAPES) ×1 IMPLANT
SUT ETHILON 3 0 FSL (SUTURE) IMPLANT
SUT ETHILON 3 0 PS 1 (SUTURE) IMPLANT
SUT NURALON 4 0 TR CR/8 (SUTURE) ×9 IMPLANT
SUT SILK 0 TIES 10X30 (SUTURE) IMPLANT
SUT VIC AB 0 CT1 18XCR BRD8 (SUTURE) ×8 IMPLANT
SUT VIC AB 0 CT1 8-18 (SUTURE) ×24
SUT VIC AB 3-0 SH 8-18 (SUTURE) ×5 IMPLANT
TAPE CLOTH 1X10 TAN NS (GAUZE/BANDAGES/DRESSINGS) ×1 IMPLANT
TOWEL GREEN STERILE (TOWEL DISPOSABLE) ×4 IMPLANT
TOWEL GREEN STERILE FF (TOWEL DISPOSABLE) ×7 IMPLANT
TRAY FOLEY MTR SLVR 16FR STAT (SET/KITS/TRAYS/PACK) ×1 IMPLANT
TUBE CONNECTING 12'X1/4 (SUCTIONS) ×2
TUBE CONNECTING 12X1/4 (SUCTIONS) ×5 IMPLANT
UNDERPAD 30X30 (UNDERPADS AND DIAPERS) ×1 IMPLANT
WATER STERILE IRR 1000ML POUR (IV SOLUTION) ×4 IMPLANT

## 2019-10-08 NOTE — Progress Notes (Signed)
I was called in regards to this patient's heart rate at 5:10am this morning. It was reported to me by the nurse that the patient was having some episodes of bradycardia but was nonsustained. It was reported to me during that, approximately 2 minute, conversation that the patient was not having any other symptoms. The nurse never mentioned anything about the patients neurologic status or her being difficult to arouse even after I asked if the patient was having any other symptoms. I gave orders for an EKG, 500cc bolus NS, and a BMET and CBC. The nurse verbalized understanding and hung up the phone. Never received another call about her until 7:09am when the rapid response nurse told me the patient was unresponsive and needed to be intubated and sent to the ICU. I gave her a verbal order to do so and told her to send the patient for a stat head CT after being intubated.

## 2019-10-08 NOTE — Significant Event (Addendum)
Rapid Response Event Note  Overview: Neurologic - Unresponsive  Initial Focused Assessment: Night RRRN received a call from bedside nurse with concerns of patient unresponsive. Upon arrival, primary nurse informed RRRNs - that the patient was bradycardiac but at 0520, patient HR dropped to 29, followed by 3.2 pause, and decreased LOC. Nurse had paged the primary service (NSU) and had received orders. At [phone] - RRRN was called because the patient was unresponsive. Day RRRN (P.Julien Oscar) also came to the bedside. Patient was completely unresponsive, bilateral pupils were fixed and non-reactive - 52mm each. Patient did not have a gag, + snoring respirations, RR was maintaining at 10-12/min, oxygen saturations were 98% on RA. HR in the 60s and BP readings were 180/110s. Nurse had already paged NSU provider on call. I was able to call the NSU APP and updated her that the patient is not protecting airway. Quickly we spoke about the patient needing to be intubated, I informed the APP that I would call the PCCM team, transfer the patient to the 4N ICU and then patient will go for STAT CT HEAD.  Patient was immediately taken to 4N ICU with RT and RRRNs.   Interventions: -- Narcan 0.4 mg IV x 2 - no improvement in neurologic status -- Emergent transfer to 4N ICU   Plan of Care: Emergently transported to 4N ICU with RT and RRRNs. I paged the PCCM team and PCCM NP immediately called me back - I asked that the PCCM team meet the patient on 4N - patient will need to be intubated for airway protection. Upon arrival to 4N - patient was having longer periods of apnea, assisted with ventilations via BVM by RRT - oxygen saturations remained greater than 98%. Underwent RSI with PCCM MD. Post intubation, had hypotension, HR went from the 140s to the 80s- pulse was weak and thready. Patient was administered 400 mcg Phenylephrine, DEFIB pads applied and placed on Zoll. Levophed started at 76mcg/kg/min and NS bolus was infusing.  SBP improved > 100. Patient was given Mannitol IV by 4N ICU RN. Decision made that patient will need immediate imaging prior to going to the OR. Assisted 4N nursing staff and RT with transport to CT, NSU MD came to CT, CT reviewed, and patient was immediately taken to the OR. 23.4% Hypertonic Saline was started upon arrival to OR by 4N ICU nurses.   Event Summary:  Call Time: [place] Time: [id number] End Time: [id number]  Netty Starring

## 2019-10-08 NOTE — Progress Notes (Signed)
  NEUROSURGERY PROGRESS NOTE   Pt found to be unresponsive around 7a. Rapid response called, patient noted to have episodes of bradycardia overnight. Pt intubated and started on pressors. Head CT obtained demonstrating progressive transtentorial herniation.  EXAM:  BP (!) 138/110   Pulse (!) 113   Temp 97.7 F (36.5 C) (Axillary)   Resp 20   Ht 5\' 5"  (1.651 m)   Wt 117 kg   SpO2 99%   BMI 42.92 kg/m   Intubated, sedated Pupils 68mm OU, non-reactive No motor responses  IMAGING: Head CT reviewed demonstrating worsening edema surrounding known right extra-axial mass, worsening transtentorial herniation with trapped temporal horn underneath the tenotrium. Complete effacement of the 4th ventricle.  IMPRESSION:  64 y.o. female with decompensation overnight. Suspect pt has possible undiagnosed OSA with progressive hypercapia overnight leading to acute decompensation. Needs emergent decompressive hemicraniectomy and resection of tumor.  PLAN: - Will proceed emergently with surgical decompression - Medical treatment of ICP in the interim with mannitor/23.4% hypertonic saline, hyperventilation  I have reviewed the situation with the patient's son  Dorothea Ogle over the phone, informed him of need for emergent surgery including craniectomy.

## 2019-10-08 NOTE — Progress Notes (Addendum)
eLink Physician-Brief Progress Note Patient Name: Kimberly Daniel DOB: 25-Jan-1956 MRN: [id number]   Date of Service  10/18/2019  HPI/Events of Note  Patient intubated, ventilated, NPO and currently on AC moderate Novolog SSI. Blood glucose = 179.   eICU Interventions  Will order: 1. Change to Q 4 hour moderate Novolog SSI.      Intervention Category Major Interventions: Hyperglycemia - active titration of insulin therapy  Lysle Dingwall 10/18/2019, 8:33 PM

## 2019-10-08 NOTE — Progress Notes (Signed)
Patient noted to be bradycardic high 30's to 40's, however noted HR down to 29 nonsustained. Patient more difficult to arouse. BP 124/78, MAP 92. Provider paged, spoke with Glenford Peers NP and made aware of concern. Patient planned for craniotomy today. Received orders for CBC, BMP, EKG and 500cc NS bolus. Will continue to monitor.

## 2019-10-08 NOTE — Anesthesia Procedure Notes (Signed)
Arterial Line Insertion Start/End05/10/2019 8:35 AM, 10/08/2019 8:40 AM Performed by: Josephine Igo, CRNA, CRNA  Patient location: OR. Preanesthetic checklist: patient identified, IV checked, site marked, risks and benefits discussed, surgical consent, monitors and equipment checked, pre-op evaluation, timeout performed and anesthesia consent Left, radial was placed Catheter size: 20 G Hand hygiene performed , maximum sterile barriers used  and Seldinger technique used Allen's test indicative of satisfactory collateral circulation Attempts: 1 Procedure performed without using ultrasound guided technique. Following insertion, dressing applied and Biopatch. Post procedure assessment: normal and unchanged  Patient tolerated the procedure well with no immediate complications.

## 2019-10-08 NOTE — Progress Notes (Signed)
Spoke with patient's son, Dorothea Ogle and made aware of room transfer. Provided room  And unit telephone number.

## 2019-10-08 NOTE — Progress Notes (Signed)
RT x2 pulled back ETT 3cm per MD order. Patient ETT is now at 21cm at the lip. RT will continue to monitor.

## 2019-10-08 NOTE — Procedures (Signed)
Intubation Procedure Note Kimberly Daniel [id number] 28-Jan-1956  Procedure: Intubation Indications: Respiratory insufficiency  Procedure Details Consent: Unable to obtain consent because of emergent medical necessity. Time Out: Verified patient identification, verified procedure, site/side was marked, verified correct patient position, special equipment/implants available, medications/allergies/relevent history reviewed, required imaging and test results available.  Performed  Maximum sterile technique was used including cap, gloves, hand hygiene and mask.  MAC and 4video assisted  Full view with some cricoid pressure. Anterior airway with small oral opening and large tongue   Evaluation Hemodynamic Status: BP stable throughout; O2 sats: stable throughout Patient's Current Condition: stable Complications: No apparent complications Patient did tolerate procedure well. Chest X-ray ordered to verify placement.  CXR: pending.   Kimberly Daniel 11/03/2019

## 2019-10-08 NOTE — Progress Notes (Signed)
I have reviewed the situation with the patient's sons at bedside. We discussed a period of time to see if Mrs. Kimberly Daniel shows signs of improvement, although I did tell them that given her preoperative condition her prognosis is poor. All their questions were answered.

## 2019-10-08 NOTE — Progress Notes (Signed)
Patient just returned from OR and RT placed patient back on ventilator on previous settings. RT will obtain ABG shortly. RT will continue to monitor.

## 2019-10-08 NOTE — Progress Notes (Signed)
While on the phone with provider, monitor room notified charge nurse of 3.20 second pause. EKG being completed.

## 2019-10-08 NOTE — Consult Note (Addendum)
NAME:  Kimberly Daniel, MRN:  [id number], DOB:  08-18-1955, LOS: 57 ADMISSION DATE:  09/06/2019, CONSULTATION DATE:  5/4 REFERRING MD:  Dr. Kathyrn Sheriff, CHIEF COMPLAINT:  Meningioma  Brief History   64 year old female with meningioma, significant edema, and midline shift. Scheduled for resection 5/4, but the night before became lethargic requiring intubation.   History of present illness   64 year female with PMH as below, which is significant for COPD and DM who was admitted 4/27 with complaints of headache and unsteady gait. CT scan done in the ED demonstrated a right frontal brain mass with significant edema. She was admitted to the neurosurgery service for further workup and was scheduled for surgical resection on 4/20. She was treated with IV decadron and Keppra for seizure ppx. Unfortunately she ate the morning of surgery and surgery was rescheduled for 5/4. In the early AM hours of 5/4 she began to experience decreased responsiveness and episodes of bradycardia. This progressed to obtundation and she required transfer to ICU for intubation. PCCM consulted.   Past Medical History   has a past medical history of COPD (chronic obstructive pulmonary disease) ([place]) and Diabetes mellitus without complication ([place]).   Significant Hospital Events   4/27 admit  4/29 surgery delayed (NPO) 5/4 acute decompensation overnight, ICU transfer, intubated.   Consults:  PCCM  Procedures:  ETT 5/4  Significant Diagnostic Tests:  [facility] 4/27 > Right frontal lobe severe vasogenic edema and 10 mm of leftward midline shift, effacement of the basilar cisterns and impending subfalcine herniation of the cingulate gyrus. 2. Partially calcified mass abutting the right frontal lobe is likely a meningioma. This may be a source of the right frontal edema. MRI brain 4/28 > 3 cm solitary, right pterional dural based mass favoring meningioma. There is contiguous profound vasogenic edema with 14 mm of midline shift  and right uncal herniation.  Micro Data:  COVID-19 4/28 neg  Antimicrobials:    Interim history/subjective:    Objective   Blood pressure 124/78, pulse (!) 47, temperature 97.7 F (36.5 C), temperature source Axillary, resp. rate 18, height 5\' 5"  (1.651 m), weight 117 kg, SpO2 100 %.        Intake/Output Summary (Last 24 hours) at 11/02/2019 0729 Last data filed at 10/11/2019 0324 Gross per 24 hour  Intake 820 ml  Output 3850 ml  Net -3030 ml   Filed Weights   09/30/2019 2117 09/23/2019 0122  Weight: 114.3 kg 117 kg    Examination: General: obese middle aged female in NAD HENT: [place]/AT, Pupils 24mm and fixed, no JVD Lungs: rhonchi bases Cardiovascular: Tachy, regular, no MRG Abdomen: Soft, non-tender, non-distended Extremities: No acute deformity or edema Neuro: Coma  Resolved Hospital Problem list     Assessment & Plan:   R frontal meningioma: complicated by significant vasogenic edema and ICP elevation - management per neurosurgery - For surgery 5/4 - Keppra for sz ppx - Decadron scheduled  Acute hypoxemic respiratory failure secondary to obtundation in the setting of presumed ICP elevation - STAT intubation - Full vent support - Hyperventilate - CXR and ABG - VAP bundle - Propofol for RASS 0 to -1 and ICP reduction.   Shock:  Distributive neurogenic - Mannitol 25g now - Levophed infusion titrated for MAP > 21mmHg - HOB elevated and head midline  DM - CBG monitoring and SSI  Best practice:  Diet: NPO Pain/Anxiety/Delirium protocol (if indicated): Propofol for RASS 0 to -1 VAP protocol (if indicated): per protocol  DVT prophylaxis: per primary GI prophylaxis: protonix Glucose control: SSI Mobility: BR Code Status: FULL Family Communication: per primary Disposition: to OR  Labs   CBC: Recent Labs  Lab 09/24/2019 2143 10/18/2019 0557  WBC 6.8 12.5*  NEUTROABS 4.4  --   HGB 15.6* 16.6*  HCT 48.8* 49.2*  MCV 90.4 85.6  PLT 302 306    Basic  Metabolic Panel: Recent Labs  Lab 09/08/2019 2143 10/17/2019 0557  NA 137 138  K 4.0 3.8  CL 106 102  CO2 23 25  GLUCOSE 91 117*  BUN 10 19  CREATININE 0.65 0.81  CALCIUM 9.3 8.9   GFR: Estimated Creatinine Clearance: 90.9 mL/min (by C-G formula based on SCr of 0.81 mg/dL). Recent Labs  Lab 09/28/2019 2143 10/13/2019 0557  WBC 6.8 12.5*    Liver Function Tests: Recent Labs  Lab 09/27/2019 2143  AST 15  ALT 16  ALKPHOS 77  BILITOT 0.5  PROT 8.2*  ALBUMIN 3.8   No results for input(s): LIPASE, AMYLASE in the last 168 hours. No results for input(s): AMMONIA in the last 168 hours.  ABG No results found for: PHART, PCO2ART, PO2ART, HCO3, TCO2, ACIDBASEDEF, O2SAT   Coagulation Profile: Recent Labs  Lab 09/29/2019 2143  INR 1.0    Cardiac Enzymes: No results for input(s): CKTOTAL, CKMB, CKMBINDEX, TROPONINI in the last 168 hours.  HbA1C: Hgb A1c MFr Bld  Date/Time Value Ref Range Status  10/06/2019 11:35 AM 6.3 (H) 4.8 - 5.6 % Final    Comment:    (NOTE) Pre diabetes:          5.7%-6.4% Diabetes:              >6.4% Glycemic control for   <7.0% adults with diabetes     CBG: Recent Labs  Lab 10/07/19 0626 10/07/19 1233 10/07/19 1655 10/07/19 2058 10/22/2019 0605  GLUCAP 109* 146* 112* 106* 114*    Review of Systems:   Patient is encephalopathic and/or intubated. Therefore history has been obtained from chart review.   Past Medical History  She,  has a past medical history of COPD (chronic obstructive pulmonary disease) ([place]) and Diabetes mellitus without complication ([place]).   Surgical History    Past Surgical History:  Procedure Laterality Date  . KNEE SURGERY    . TUBAL LIGATION       Social History   reports that she has been smoking. She has never used smokeless tobacco. She reports that she does not drink alcohol or use drugs.   Family History   Her family history is not on file.   Allergies No Known Allergies   Home Medications  Prior to  Admission medications   Medication Sig Start Date End Date Taking? Authorizing Provider  acetaminophen (TYLENOL) 500 MG tablet Take 1,000 mg by mouth every 6 (six) hours as needed for mild pain.   Yes [provider]  diphenhydrAMINE (BENADRYL) 25 mg capsule Take 25 mg by mouth every 6 (six) hours as needed for itching.   Yes [provider]  ibuprofen (ADVIL,MOTRIN) 200 MG tablet Take 400 mg by mouth every 6 (six) hours as needed. FOR PAIN   Yes [provider]  montelukast (SINGULAIR) 10 MG tablet Take 10 mg by mouth daily. 08/30/19  Yes [provider]  Multiple Vitamin (MULTIVITAMIN WITH MINERALS) TABS Take 1 tablet by mouth daily. 50 PLUS CENTRUM   Yes [provider]  simvastatin (ZOCOR) 10 MG tablet Take 10 mg by mouth daily. 08/30/19  Yes [provider]  acetaminophen (TYLENOL) 325 MG tablet Take 2 tablets (650 mg total) by mouth every 6 (six) hours as needed. Patient not taking: Reported on 10/02/2019 09/10/15   Sam, Olivia Canter, PA-C  benzonatate (TESSALON) 100 MG capsule Take 1 capsule (100 mg total) by mouth every 8 (eight) hours. Patient not taking: Reported on 10/02/2019 09/10/15   Anne Ng, PA-C  clotrimazole (LOTRIMIN) 1 % cream Apply to affected area 2 times daily Patient not taking: Reported on 10/02/2019 04/14/19   Isla Pence, MD  fluticasone [facility]) 50 MCG/ACT nasal spray Place 2 sprays into both nostrils daily. Patient not taking: Reported on 10/02/2019 09/10/15   Sam, Olivia Canter, PA-C  ondansetron (ZOFRAN ODT) 4 MG disintegrating tablet Take 1 tablet (4 mg total) by mouth every 8 (eight) hours as needed for nausea or vomiting. Patient not taking: Reported on 10/02/2019 09/10/15   Sam, Olivia Canter, PA-C  Pramoxine HCl 1 % CREA Apply 1 inch topically 4 (four) times daily. Patient not taking: Reported on 10/02/2019 04/14/19   Isla Pence, MD     Critical care time: 54 mins     Georgann Housekeeper, AGACNP-BC [place] for personal pager PCCM on call pager [phone]  10/25/2019 8:08 AM

## 2019-10-08 NOTE — Progress Notes (Signed)
0630-Rapid nurse contacted due to patient's decreased responsiveness. Not arousing to verbal or tactile stimulus. Vitals obtained. 161/87, HR 72. Spo2 96% on oxygen at 2L via [place]> Narcan administered by rapid. No change. Decided to transport to Neuro ICU for intubation.   0735-Patient transported to neuro ICU. Belongings bagged.

## 2019-10-08 NOTE — Op Note (Signed)
NEUROSURGERY OPERATIVE NOTE   PREOP DIAGNOSIS:  1. Right frontal meningioma 2. Brain Herniation, Intracranial Hypertension   POSTOP DIAGNOSIS: Same  PROCEDURE: 1. Decompressive right frontotemporoparietal craniectomy 2. Resection of right frontal meningioma 3. Placement of bone flap into subcutaneous abdominal pocket  SURGEON: Dr. Consuella Lose, MD  ASSISTANT: Dr. Charlie Pitter, MD  PHYSICIAN ASSISTANT: Ferne Reus, PA-C  ANESTHESIA: General Endotracheal  EBL: 100cc  SPECIMENS: Right frontal tumor for permanent pathology  DRAINS: None  COMPLICATIONS: None immediate  CONDITION: Hemodynamically stable to ICU  HISTORY: Kimberly Daniel is a 64 y.o. female initially presented to the hospital with several months of progressively worsening confusion and gait instability.  Initial work-up included CT scan followed by MRI demonstrating a right frontal extra-axial homogeneously enhancing mass consistent with meningioma with marked surrounding vasogenic edema.  She was initially scheduled for surgery about 5 days ago but unfortunately was not kept n.p.o.  Surgery was then surgery was therefore rescheduled surgery was therefore rescheduled.  This morning she was found to be acutely unresponsive requiring intubation for airway protection.  She was also noted to have fixed and dilated pupils bilaterally.  Emergent CT scan was obtained demonstrating worsening of transtentorial herniation with brainstem compression and trapping of the right temporal horn.  She was therefore emergently brought to the operating room for decompressive craniectomy and resection of tumor.  PROCEDURE IN DETAIL: The patient was brought to the operating room. After induction of general anesthesia, the patient was positioned on the operative table in the supine position in the [place] head holder.  The patient had already been administered a dose of mannitol as well as 23.4% saline.  She was maintained  hypocapnic by the anesthesia service during surgery.  All pressure points were meticulously padded.  Standard reverse question-mark frontotemporoparietal skin incision was then marked out and prepped and draped in the usual sterile fashion.  After timeout was conducted, skin incision was made with scalpel and carried down through the subcutaneous tissue and the galea.  Raney clips were applied for hemostasis.  Bovie electrocautery was used to incise the periosteum as well as the temporalis muscle and fascia.  Single piece myocutaneous flap was elevated and reflected anteriorly.  High-speed drill was used to create multiple bur holes in the frontal, temporal, and parietal regions.  These were connected with the craniotome and a single piece trauma flap was elevated.  Hemostasis on the dura was secured with bipolar electrocautery.  Dura was noted to be extremely tense.  Leksell rongeurs were used to remove the squamous temporal bone until the floor of the middle cranial fossa and the temporal pole were identified for good temporal decompression.  The lesser wing of the sphenoid was also removed with the Leksell rongeurs.  At this point the dura was opened in stellate fashion.  The underlying brain was noted to be extremely tense, and began to herniate through the craniectomy defect.  No active bleeding of the brain surface was identified.  Tumor was immediately seen in the inferior frontal region.  The dura surrounding the tumor was incised.  Utilizing traction, suction, and bipolar electrocautery, a good plane was identified between the tumor and the surrounding frontal matter.  This was developed circumferentially around the tumor, with multiple pial vessels noted to be feeding the tumor coagulated and divided.  Tumor was then sent for permanent pathology.  The resection bed was then irrigated with normal saline.  Hemostasis was secured using a combination of bipolar electrocautery and  morselized Gelfoam and  thrombin.  No further tumor was identified.  The wound was then irrigated again with normal saline.  Again, no active bleeding was identified.  At this point the dural leaflets were replaced over the brain surface and covered with a large piece of collagen onlay graft.  The temporalis muscle and fascia was then approximated with interrupted 0 Vicryl stitches.  The galea was approximated with interrupted 0 Vicryl stitches and the skin was closed with staples.  Attention was then turned to placement of the bone flap into the abdominal pocket.  A linear incision was made in the right lower quadrant.  Subcutaneous tissue was dissected and a pocket was created.  Hemostasis was secured with bipolar electrocautery, monopolar electrocautery, and morselized Gelfoam with thrombin.  The pocket was then irrigated with normal saline with antibiotic.  Bone flap was then placed in the pocket.  The wound was then closed with interrupted 0 Vicryl stitches.  Skin was closed with staples.  Bacitracin ointment and sterile dressings were then applied to both wounds.  At the end of the case all sponge, needle, instrument, and cottonoid counts were correct.  The patient was then transferred to the stretcher and taken back to the intensive care unit in critical but stable hemodynamic condition.

## 2019-10-08 NOTE — Progress Notes (Signed)
NAME:  Kimberly Daniel, MRN:  [id number], DOB:  April 13, 1956, LOS: 6 ADMISSION DATE:  09/29/2019, CONSULTATION DATE:  10/26/2019 REFERRING MD:  Kathyrn Sheriff MD, CHIEF COMPLAINT:  Meningioma   Brief History   Ms. Kimberly Daniel is a 64 yo F with a h/o gestational DM and allergic rhinitis who presented to [facility] ED with 1 week of headache, gait instability, and confusion. She had no prior history of consistent headaches. She began to have memory difficulties, including forgetting where she was driving, misplacing meaningful items, etc. This prompted her to seek care in the ED. On presentation, vitals were stable. She did not have changes in vision, dizziness, numbness, tingling, or aphasia. Head CT revealed 3 cm solitary, right  Pterional dural-based mass, likely meningioma, with significant associated vasogenic edema and resulting 14 mm of midline shift. She was admitted to the [facility] Neurosurgery service. Her surgery on 4/29 was delayed as she was not NPO prior. She continued to do well until 5/4, when she was noted to have episodes of bradycardia overnight. She was then found to be unresponsive at 7AM; Rapid response was called, pt was emergently transferred to ICU, intubated, and PCCM was consulted.   Past Medical History  Gestational DM  Allergic Rhinitis   Significant Hospital Events   4/27: Admitted  4/29: Neurosurgery delayed d/t not NPO 5/4: Acute decompensation and intubation  5/4: Decompressive R frontotemporoparietal craniectomy for resection of R frontal meningioma; placement of bone flap into subcutaneous abdominal pocket  Consults:  Neurosurgery (primary) PCCM  Procedures:  5/4: ETT emergently  5/4: Decompressive R frontotemporoparietal craniectomy for resection of R frontal meningioma; placement of bone flap into subcutaneous abdominal pocket 5/4: CVC placement, RIJ  5/4: A line  Significant Diagnostic Tests:  4/27 Head CT Noncon:  1. Right frontal lobe severe vasogenic edema  and 10 mm of leftward midline shift, effacement of the basilar cisterns and impending subfalcine herniation of the cingulate gyrus. 2. Partially calcified mass abutting the right frontal lobe is likely a meningioma. While this may be a source of the right frontal edema, there also could be an intraparenchymal lesion not visible by CT. MRI of the head with and without contrast might be helpful for further evaluation. 3. No acute hemorrhage.  4/28: Brain MRI W WO Con:  3 cm solitary, right pterional dural based mass favoring meningioma. There is contiguous profound vasogenic edema with 14 mm of midline shift and right uncal herniation.  5/4 CT Head Noncon:  Stable size of meningioma more fully characterized on prior MRI. There is persistent marked edema and mass effect, with further progression since 09/27/2019. This includes subfalcine, uncal, and cerebellar tonsillar herniation with trapping of the herniated temporal horn. There may be mild trapping of the posterior left lateral ventricle.  5/4: CXR:  1. Tube and catheter positions as described without pneumothorax. Note that the endotracheal tube tip is at the carina. Advise withdrawing endotracheal tube approximately 2.5-3 cm. 2. Consolidation left lower lobe with small left pleural effusion. Concern for potential pneumonia or aspiration in this area. 3.  Right upper lobe atelectatic change. 4.  Stable cardiac silhouette.  Micro Data:  4/29: MRSA PCR negative  Antimicrobials:  5/4: Ancef 2g preop ppx   Interim history/subjective:  5/5: NAE post-operatively. Pt received CVC and a-line. Levophed was started to increase BP. VSS with T36, BP 120s-160s90s, HR 70s. Pt doing well on vent settings below. Good UOP of 3.1L. ABG at 1200 was 7.48/37.1/385/27.7, serum osmolality 304. Morning  labs significant for Na 148, K 3.7.   5/4: Pt noted to have episodes of bradycardia overnight, found to be unresponsive at 7AM. Rapid response was  called. Pt was intubated and started on Levophed and Neo; Neo discontinued shortly thereafter. Head CT showed progressive transtentorial herniation. She underwent decompressive R frontotemporoparietal craniectomy for resection of R frontal meningioma without immediate complication. VSS with Bps in 110s/90s, HR 70s, T36.5. SpO2 100 on PRVC FiO2 50%, PEEP 5, plateau 17.   Objective   Blood pressure (!) 115/98, pulse 73, temperature 97.7 F (36.5 C), temperature source Axillary, resp. rate (!) 24, height 5\' 5"  (1.651 m), weight 117 kg, SpO2 100 %.    Vent Mode: PRVC FiO2 (%):  [40 %-100 %] 50 % Set Rate:  [24 bmp] 24 bmp Vt Set:  [450 mL] 450 mL PEEP:  [5 cmH20] 5 cmH20 Plateau Pressure:  [11 cmH20-17 cmH20] 17 cmH20   Intake/Output Summary (Last 24 hours) at 10/14/2019 1244 Last data filed at 10/14/2019 [id number] Gross per 24 hour  Intake 2200 ml  Output 4950 ml  Net -2750 ml   Filed Weights   09/21/2019 2117 09/13/2019 0122  Weight: 114.3 kg 117 kg    Examination: General: middle aged woman in NAD, resting comfortably. Intubated and unresponsive HENT: Atraumatic, normocephalic. Surgical wound boggy, CDI, staples in place. Significant swelling of R eye. L pupil larger than R, both pupils nonreactive Lungs: mild rhonchi at bilateral bases. No wheezing.  Cardiovascular: RRR no m/r/g Abdomen: Normoactive bowel sounds. Soft, nondistended.  Extremities: No clubbing, cyanosis, or edema. Pulses 2+. No physical deformity.  Neuro: Absent corneal reflex bilaterally. Doll's face nonresponsive. Absent gag reflex. No motor responses. GU: Foley in place   Resolved Hospital Problem list   none  Assessment & Plan:  Ms. Kimberly Daniel is a 64 year old female who presented for progressive headache and confusion found to have 3 cm solitary, right pterional dural based mass c/f meningioma.   Acute hypoxemic respiratory failure: Pt acutely decompensated with bradycardia on morning of 5/4, found unresponsive at  0700. She was emergently intubated without event. CXR confirmed ETT placement. Most recent ABG 5/4 at 1200 was 7.48/37.1/385/27.7. Continues to do well with SpO2 100 on PRVC FiO2 50%, PEEP 5, Plat 17 at 24 bpm. She is still showing respiratory effort, breathing at 32 bpm.  - Continue Vent support  - VAP per protocol  - Continue Levophed   R pterional mass c/f Meningioma s/p resection and craniectomy: Pt found to have 3 cm solitary, right pterional dural based mass favoring meningioma on brain MRI. Pt underwent emergent resection on 5/4 after a rapid response for hemodynamic instability requiring intubation. Unfortunately, repeat head CT showed original uncal herniation has transformed into subfalcine, uncal, and cerebellar tonsillar herniation with trapping of the herniated temporal horn. She has been stable but unresponsive post-operatively. ICP controlled with 3% NS at 78mL/hr since 5/4 and also craniectomy. Neurosurgery actively managing. Pt unresponsive without sedation currently; has absent corneal and gag reflexes however shows inspiratory effort as above. Will consider hyperventilation if concern remains for elevated ICP. - Neurosurgery managing - 3% NS 75 mL/hr  - s/p craniectomy - Continue Decadron 4mg  q6h per nsgy  - Continue Keppra 500mg  bid  - trend Na per NSGY   Allergic Rhinitis: Long history. Stable.  - Continue home montelukast   FEN/GI: Pt currently NPO. Last K 3.2. As we hyperventilate to decrease ICP, expect K to drop more.  - Miralax Prn, Colace bid  Best practice:  Diet: NPO Pain/Anxiety/Delirium protocol (if indicated): Fentanyl/Dilaudid/Propofol ; none running  VAP protocol (if indicated): Per protocol  DVT prophylaxis: heparin  GI prophylaxis: Protonix Glucose control: SSI Mobility: Intubated, bedrest  Code Status: FULL  Family Communication: Per NSGY, updated 5/4 AM post-operatively Disposition: ICU   Labs   CBC: Recent Labs  Lab 10/03/2019 2143  10/07/2019 0557 10/22/2019 1200  WBC 6.8 12.5*  --   NEUTROABS 4.4  --   --   HGB 15.6* 16.6* 16.0*  HCT 48.8* 49.2* 47.0*  MCV 90.4 85.6  --   PLT 302 306  --     Basic Metabolic Panel: Recent Labs  Lab 09/09/2019 2143 10/09/2019 0557 10/13/2019 1156 10/09/2019 1200  NA 137 138 138 140  K 4.0 3.8  --  3.2*  CL 106 102  --   --   CO2 23 25  --   --   GLUCOSE 91 117*  --   --   BUN 10 19  --   --   CREATININE 0.65 0.81  --   --   CALCIUM 9.3 8.9  --   --    GFR: Estimated Creatinine Clearance: 90.9 mL/min (by C-G formula based on SCr of 0.81 mg/dL). Recent Labs  Lab 09/28/2019 2143 10/27/2019 0557  WBC 6.8 12.5*    Liver Function Tests: Recent Labs  Lab 09/24/2019 2143  AST 15  ALT 16  ALKPHOS 77  BILITOT 0.5  PROT 8.2*  ALBUMIN 3.8   No results for input(s): LIPASE, AMYLASE in the last 168 hours. No results for input(s): AMMONIA in the last 168 hours.  ABG    Component Value Date/Time   PHART 7.482 (H) 10/20/2019 1200   PCO2ART 37.1 10/22/2019 1200   PO2ART 385 (H) 10/31/2019 1200   HCO3 27.7 10/22/2019 1200   TCO2 29 10/26/2019 1200   O2SAT 100.0 10/27/2019 1200     Coagulation Profile: Recent Labs  Lab 10/04/2019 2143  INR 1.0    Cardiac Enzymes: No results for input(s): CKTOTAL, CKMB, CKMBINDEX, TROPONINI in the last 168 hours.  HbA1C: Hgb A1c MFr Bld  Date/Time Value Ref Range Status  10/06/2019 11:35 AM 6.3 (H) 4.8 - 5.6 % Final    Comment:    (NOTE) Pre diabetes:          5.7%-6.4% Diabetes:              >6.4% Glycemic control for   <7.0% adults with diabetes     CBG: Recent Labs  Lab 10/07/19 1233 10/07/19 1655 10/07/19 2058 11/02/2019 0605 10/30/2019 1127  GLUCAP 146* 112* 106* 114* 168*    Past Medical History  She,  has a past medical history of COPD (chronic obstructive pulmonary disease) ([place]) and Diabetes mellitus without complication ([place]).   Surgical History    Past Surgical History:  Procedure Laterality Date  . KNEE  SURGERY    . TUBAL LIGATION       Social History   reports that she has been smoking. She has never used smokeless tobacco. She reports that she does not drink alcohol or use drugs.   Family History   Her family history is not on file.   Allergies No Known Allergies   Home Medications  Prior to Admission medications   Medication Sig Start Date End Date Taking? Authorizing Provider  acetaminophen (TYLENOL) 500 MG tablet Take 1,000 mg by mouth every 6 (six) hours as needed for mild pain.   Yes [provider]  diphenhydrAMINE (BENADRYL) 25 mg capsule Take 25 mg by mouth every 6 (six) hours as needed for itching.   Yes [provider]  ibuprofen (ADVIL,MOTRIN) 200 MG tablet Take 400 mg by mouth every 6 (six) hours as needed. FOR PAIN   Yes [provider]  montelukast (SINGULAIR) 10 MG tablet Take 10 mg by mouth daily. 08/30/19  Yes [provider]  Multiple Vitamin (MULTIVITAMIN WITH MINERALS) TABS Take 1 tablet by mouth daily. 50 PLUS CENTRUM   Yes [provider]  simvastatin (ZOCOR) 10 MG tablet Take 10 mg by mouth daily. 08/30/19  Yes [provider]  acetaminophen (TYLENOL) 325 MG tablet Take 2 tablets (650 mg total) by mouth every 6 (six) hours as needed. Patient not taking: Reported on 10/02/2019 09/10/15   Sam, Olivia Canter, PA-C  benzonatate (TESSALON) 100 MG capsule Take 1 capsule (100 mg total) by mouth every 8 (eight) hours. Patient not taking: Reported on 10/02/2019 09/10/15   Anne Ng, PA-C  clotrimazole (LOTRIMIN) 1 % cream Apply to affected area 2 times daily Patient not taking: Reported on 10/02/2019 04/14/19   Isla Pence, MD  fluticasone [facility]) 50 MCG/ACT nasal spray Place 2 sprays into both nostrils daily. Patient not taking: Reported on 10/02/2019 09/10/15   Sam, Olivia Canter, PA-C  ondansetron (ZOFRAN ODT) 4 MG disintegrating tablet Take 1 tablet (4 mg total) by mouth every 8 (eight) hours as needed for nausea or  vomiting. Patient not taking: Reported on 10/02/2019 09/10/15   Sam, Olivia Canter, PA-C  Pramoxine HCl 1 % CREA Apply 1 inch topically 4 (four) times daily. Patient not taking: Reported on 10/02/2019 04/14/19   Isla Pence, MD     Note by med student

## 2019-10-08 NOTE — Anesthesia Procedure Notes (Addendum)
Central Venous Catheter Insertion Performed by: Dannisha Eckmann, MD, anesthesiologist Patient location: Pre-op. Preanesthetic checklist: patient identified, IV checked, site marked, risks and benefits discussed, surgical consent, monitors and equipment checked, pre-op evaluation, timeout performed and anesthesia consent Position: Trendelenburg Lidocaine 1% used for infiltration and patient sedated Hand hygiene performed , maximum sterile barriers used  and Seldinger technique used Catheter size: 8 Fr Total catheter length 16. Central line was placed.Double lumen Procedure performed using ultrasound guided technique. Ultrasound Notes:anatomy identified, needle tip was noted to be adjacent to the nerve/plexus identified, no ultrasound evidence of intravascular and/or intraneural injection and image(s) printed for medical record Attempts: 1 Following insertion, dressing applied, line sutured and Biopatch. Post procedure assessment: blood return through all ports, free fluid flow and no air  Patient tolerated the procedure well with no immediate complications.          

## 2019-10-08 NOTE — Anesthesia Postprocedure Evaluation (Signed)
Anesthesia Post Note  Patient: Kimberly Daniel  Procedure(s) Performed: RIGHT FRONTAL CRANIOTOMY FOR MENINGIOMA, WITH PLACEMENT OF BONE FLAP IN ABDOMEN. (Right Head)     Patient location during evaluation: SICU Anesthesia Type: General Level of consciousness: sedated and patient remains intubated per anesthesia plan Pain management: pain level controlled Vital Signs Assessment: post-procedure vital signs reviewed and stable Respiratory status: patient on ventilator - see flowsheet for VS and patient remains intubated per anesthesia plan Cardiovascular status: stable Anesthetic complications: no    Last Vitals:  Vitals:   10/15/2019 1500 10/12/2019 1519  BP: 112/68   Pulse: 86 80  Resp: (!) 24 (!) 24  Temp: (!) 36.1 C (!) 36.2 C  SpO2: 100% 100%    Last Pain:  Vitals:   10/12/2019 0510  TempSrc:   PainSc: 0-No pain                 Nolon Nations

## 2019-10-08 NOTE — Progress Notes (Signed)
Sent ring (removed by OR) home with son Legrand Como.

## 2019-10-08 NOTE — Transfer of Care (Signed)
Immediate Anesthesia Transfer of Care Note  Patient: Kimberly Daniel  Procedure(s) Performed: RIGHT FRONTAL CRANIOTOMY FOR MENINGIOMA (Right Head)  Patient Location: NICU  Anesthesia Type:General  Level of Consciousness: Patient remains intubated per anesthesia plan  Airway & Oxygen Therapy: Patient remains intubated per anesthesia plan and Patient placed on Ventilator (see vital sign flow sheet for setting)  Post-op Assessment: Report given to RN and Post -op Vital signs reviewed and stable  Post vital signs: Reviewed and stable  Last Vitals:  Vitals Value Taken Time  BP    Temp    Pulse    Resp    SpO2      Last Pain:  Vitals:   10/09/2019 0510  TempSrc:   PainSc: 0-No pain      Patients Stated Pain Goal: 0 ([id number])  Complications: No apparent anesthesia complications

## 2019-10-08 NOTE — OR Nursing (Signed)
Patient had a ring on right hand that was removed and given to ICU Neuro nurse when patient was taken to ICU RM 24. Bradley Bostelman RN

## 2019-10-09 ENCOUNTER — Encounter: Payer: Self-pay | Admitting: *Deleted

## 2019-10-09 DIAGNOSIS — G935 Compression of brain: Secondary | ICD-10-CM

## 2019-10-09 DIAGNOSIS — J9601 Acute respiratory failure with hypoxia: Secondary | ICD-10-CM

## 2019-10-09 LAB — BASIC METABOLIC PANEL
Anion gap: 13 (ref 5–15)
BUN: 17 mg/dL (ref 8–23)
CO2: 24 mmol/L (ref 22–32)
Calcium: 7.9 mg/dL — ABNORMAL LOW (ref 8.9–10.3)
Chloride: 111 mmol/L (ref 98–111)
Creatinine, Ser: 0.9 mg/dL (ref 0.44–1.00)
GFR calc Af Amer: >60 mL/min (ref >60)
GFR calc non Af Amer: >60 mL/min (ref >60)
Glucose, Bld: 139 mg/dL — ABNORMAL HIGH (ref 70–99)
Potassium: 3.7 mmol/L (ref 3.5–5.1)
Sodium: 148 mmol/L — ABNORMAL HIGH (ref 135–145)

## 2019-10-09 LAB — GLUCOSE, CAPILLARY
Glucose-Capillary: 111 mg/dL — ABNORMAL HIGH (ref 70–99)
Glucose-Capillary: 131 mg/dL — ABNORMAL HIGH (ref 70–99)
Glucose-Capillary: 133 mg/dL — ABNORMAL HIGH (ref 70–99)
Glucose-Capillary: 136 mg/dL — ABNORMAL HIGH (ref 70–99)
Glucose-Capillary: 141 mg/dL — ABNORMAL HIGH (ref 70–99)
Glucose-Capillary: 150 mg/dL — ABNORMAL HIGH (ref 70–99)

## 2019-10-09 LAB — OSMOLALITY: Osmolality: 304 mOsm/kg — ABNORMAL HIGH (ref 275–295)

## 2019-10-09 LAB — SODIUM
Sodium: 144 mmol/L (ref 135–145)
Sodium: 149 mmol/L — ABNORMAL HIGH (ref 135–145)
Sodium: 152 mmol/L — ABNORMAL HIGH (ref 135–145)

## 2019-10-09 LAB — TRIGLYCERIDES: Triglycerides: 91 mg/dL (ref <150)

## 2019-10-09 MED ORDER — SIMVASTATIN 20 MG PO TABS
10.0000 mg | ORAL_TABLET | Freq: Every day | ORAL | Status: DC
  Administered 2019-10-09: 10 mg
  Filled 2019-10-09: qty 1

## 2019-10-09 MED ORDER — POLYETHYLENE GLYCOL 3350 17 G PO PACK: 17.0000 g | PACK | Freq: Every day | ORAL | Status: DC | PRN

## 2019-10-09 MED ORDER — ADULT MULTIVITAMIN W/MINERALS CH
1.0000 | ORAL_TABLET | Freq: Every day | ORAL | Status: DC
  Administered 2019-10-09: 1
  Filled 2019-10-09: qty 1

## 2019-10-09 MED ORDER — OXYCODONE HCL 5 MG PO TABS: 5.0000 mg | ORAL_TABLET | ORAL | Status: DC | PRN

## 2019-10-09 MED ORDER — ONDANSETRON HCL 4 MG PO TABS: 4.0000 mg | ORAL_TABLET | Freq: Four times a day (QID) | ORAL | Status: DC | PRN

## 2019-10-09 MED ORDER — ZOLPIDEM TARTRATE 5 MG PO TABS: 5.0000 mg | ORAL_TABLET | Freq: Every evening | ORAL | Status: DC | PRN

## 2019-10-09 MED ORDER — ONDANSETRON HCL 4 MG/2ML IJ SOLN: 4.0000 mg | Freq: Four times a day (QID) | INTRAMUSCULAR | Status: DC | PRN

## 2019-10-09 MED ORDER — DIPHENHYDRAMINE HCL 25 MG PO CAPS: 25.0000 mg | ORAL_CAPSULE | Freq: Four times a day (QID) | ORAL | Status: DC | PRN

## 2019-10-09 MED FILL — Thrombin For Soln 5000 Unit: CUTANEOUS | Qty: 5000 | Status: AC

## 2019-10-09 NOTE — Progress Notes (Signed)
Admitted 4/28 with 3 cm right meningioma with edema and 40 mm midline shift and mild uncal herniation. On 5/4 developed bradycardia and coma, with bilateral dilated pupils, head CT showing worse herniation. Underwent right craniectomy and resection of right frontal meningioma   Critically ill, intubated, no sedation, comatose, GCS 3, pupils dilated not reactive to right, right 6 mm, left 4 mm, absent, no conjunctival reflexes, no doll's side, has spontaneous respirations, right orbital swelling post surgery, bilateral ventilated breath sounds, S1-S2 regular, 1+ edema  Chest x-ray 5/4 personally reviewed which shows ET tube at the carina, consolidation left lower lobe with atelectasis right upper lobe.  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 Critical Care Time devoted to patient care services described in this note independent of APP/resident  time is 31 minutes.   Leanna Sato Elsworth Soho MD

## 2019-10-09 NOTE — Progress Notes (Signed)
  NEUROSURGERY PROGRESS NOTE   No issues overnight.  Remains intubated.  No sedation running  EXAM:  BP (!) 142/94   Pulse 90   Temp 97.7 F (36.5 C)   Resp (!) 24   Ht 5\' 5"  (1.651 m)   Wt 117 kg   SpO2 98%   BMI 42.92 kg/m   Intubated Right pupil 15mm, nonreactive. Left pupil, 61mm nonreactive No response to central pain. Crani incision: boggy, no active drainage  IMPRESSION/PLAN 64 y.o. female POD1 emergent crani for resection of meningioma. Remains intubated, no sedation running. No change overnight neurologically. Prognosis poor. Will touch base with family today.

## 2019-10-10 ENCOUNTER — Inpatient Hospital Stay (HOSPITAL_COMMUNITY): Payer: Self-pay

## 2019-10-10 DIAGNOSIS — D496 Neoplasm of unspecified behavior of brain: Secondary | ICD-10-CM

## 2019-10-10 LAB — TYPE AND SCREEN
ABO/RH(D): A POS
Antibody Screen: NEGATIVE
Unit division: 0
Unit division: 0

## 2019-10-10 LAB — BPAM RBC
Blood Product Expiration Date: 202105272359
Blood Product Expiration Date: 202105292359
Unit Type and Rh: 6200
Unit Type and Rh: 6200

## 2019-10-10 LAB — GLUCOSE, CAPILLARY
Glucose-Capillary: 113 mg/dL — ABNORMAL HIGH (ref 70–99)
Glucose-Capillary: 110 mg/dL — ABNORMAL HIGH (ref 70–99)
Glucose-Capillary: 121 mg/dL — ABNORMAL HIGH (ref 70–99)
Glucose-Capillary: 131 mg/dL — ABNORMAL HIGH (ref 70–99)
Glucose-Capillary: 147 mg/dL — ABNORMAL HIGH (ref 70–99)
Glucose-Capillary: 125 mg/dL — ABNORMAL HIGH (ref 70–99)

## 2019-10-10 LAB — SODIUM
Sodium: 155 mmol/L — ABNORMAL HIGH (ref 135–145)
Sodium: 157 mmol/L — ABNORMAL HIGH (ref 135–145)
Sodium: 158 mmol/L — ABNORMAL HIGH (ref 135–145)
Sodium: 156 mmol/L — ABNORMAL HIGH (ref 135–145)

## 2019-10-10 LAB — SURGICAL PATHOLOGY

## 2019-10-10 MED ORDER — VITAL HIGH PROTEIN PO LIQD
1000.0000 mL | ORAL | Status: DC
  Administered 2019-10-10 – 2019-10-16 (×5): 1000 mL

## 2019-10-10 MED ORDER — FUROSEMIDE 10 MG/ML IJ SOLN
20.0000 mg | Freq: Once | INTRAMUSCULAR | Status: AC
  Administered 2019-10-10: 20 mg via INTRAVENOUS
  Filled 2019-10-10: qty 2

## 2019-10-10 MED ORDER — DOCUSATE SODIUM 50 MG/5ML PO LIQD: 100.0000 mg | Freq: Two times a day (BID) | ORAL | Status: DC | PRN

## 2019-10-10 NOTE — Progress Notes (Signed)
Admitted 4/28 with 3 cm right meningioma with edema and 40 mm midline shift and mild uncal herniation. On 5/4 developed bradycardia and coma, with bilateral dilated pupils, head CT showing worse herniation. Underwent right craniectomy and resection of right frontal meningioma   Vitals:   10/10/19 0800 10/10/19 0804  BP: 111/65 (!) 167/80  Pulse: 70 73  Resp: (!) 24 (!) 25  Temp: (!) 97.3 F (36.3 C) (!) 97.3 F (36.3 C)  SpO2: 98% 98%    Remains critically ill, intubated, comatose, bilateral orbital edema, right pupil 5 mm, left 3 mm "not reactive to light, absent corneal and conjunctival reflexes, doll's eye absent, bilateral ventilated breath sounds, has spontaneous respiratory trigger vent rate turned down to 10, 1+ edema  Impression/plan Right meningioma with cerebral edema and brain herniation -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  She continues to have some respiratory trigger although this appears less than yesterday, does not meet criteria for brain death -Continue Decadron and hypertonic saline per neurosurgery -Continue Keppra  Acute respiratory failure -full vent support  Continue other supportive care, add trickle tube feeds, family updates per neurosurgery.  The patient is critically ill with multiple organ systems failure and requires high complexity decision making for assessment and support, frequent evaluation and titration of therapies, application of advanced monitoring technologies and extensive interpretation of multiple databases. Critical Care Time devoted to patient care services described in this note independent of APP/resident  time is 31 minutes.   Leanna Sato Elsworth Soho MD

## 2019-10-10 NOTE — Progress Notes (Signed)
NAME:  Kimberly Daniel, MRN:  [id number], DOB:  [birth year], LOS: 8 ADMISSION DATE:  09/21/2019, CONSULTATION DATE:  10/27/2019 REFERRING MD:  Kathyrn Sheriff MD, CHIEF COMPLAINT:  Meningioma   Brief History   Ms. Kimberly Daniel is a 64 yo F with a h/o gestational DM and allergic rhinitis who presented to [facility] ED with 1 week of headache, gait instability, and confusion. She had no prior history of consistent headaches. She began to have memory difficulties, including forgetting where she was driving, misplacing meaningful items, etc. This prompted her to seek care in the ED. On presentation, vitals were stable. She did not have changes in vision, dizziness, numbness, tingling, or aphasia. Head CT revealed 3 cm solitary, right  Pterional dural-based mass, likely meningioma, with significant associated vasogenic edema and resulting 14 mm of midline shift. She was admitted to the [facility] Neurosurgery service. Her surgery on 4/29 was delayed as she was not NPO prior. She continued to do well until 5/4, when she was noted to have episodes of bradycardia overnight. She was then found to be unresponsive at 7AM; Rapid response was called, pt was emergently transferred to ICU, intubated, and PCCM was consulted.   Past Medical History  Gestational DM  Allergic Rhinitis   Significant Hospital Events   4/27: Admitted  4/29: Neurosurgery delayed d/t not NPO 5/4: Acute decompensation and intubation  5/4: Decompressive R frontotemporoparietal craniectomy for resection of R frontal meningioma; placement of bone flap into subcutaneous abdominal pocket  Consults:  Neurosurgery (primary) PCCM  Procedures:  5/4: ETT emergently  5/4: Decompressive R frontotemporoparietal craniectomy for resection of R frontal meningioma; placement of bone flap into subcutaneous abdominal pocket 5/4: CVC placement, RIJ  5/4: A line  Significant Diagnostic Tests:  4/27 Head CT Noncon:  1. Right frontal lobe severe vasogenic edema  and 10 mm of leftward midline shift, effacement of the basilar cisterns and impending subfalcine herniation of the cingulate gyrus. 2. Partially calcified mass abutting the right frontal lobe is likely a meningioma. While this may be a source of the right frontal edema, there also could be an intraparenchymal lesion not visible by CT. MRI of the head with and without contrast might be helpful for further evaluation. 3. No acute hemorrhage.  4/28: Brain MRI W WO Con:  3 cm solitary, right pterional dural based mass favoring meningioma. There is contiguous profound vasogenic edema with 14 mm of midline shift and right uncal herniation.  5/4 CT Head Noncon:  Stable size of meningioma more fully characterized on prior MRI. There is persistent marked edema and mass effect, with further progression since 09/18/2019. This includes subfalcine, uncal, and cerebellar tonsillar herniation with trapping of the herniated temporal horn. There may be mild trapping of the posterior left lateral ventricle.  5/4: CXR:  1. Tube and catheter positions as described without pneumothorax. Note that the endotracheal tube tip is at the carina. Advise withdrawing endotracheal tube approximately 2.5-3 cm. 2. Consolidation left lower lobe with small left pleural effusion. Concern for potential pneumonia or aspiration in this area. 3.  Right upper lobe atelectatic change. 4.  Stable cardiac silhouette.  Micro Data:  4/29: MRSA PCR negative  Antimicrobials:  5/4: Ancef 2g preop ppx   Interim history/subjective:  5/6: NAE. Pt unresponsive on no sedation. Levophed now titrated off. VSS T36, BP 120s/70s, HR 60s, SpO2 100 on PRVC 400/30%/+5.  Pt has not been on diet and glucose 110s-130s.  Pupillary, corneal, gag, cough, and Doll's face all  nonresponsive. GCS 2T. Pt notably had respiratory drive at 24 bpm yesterday and did not today. When rate was reduced to 10 bpm, pt demonstrated respiratory effort; does not  meet criteria for brain death.   5/5: NAE post-operatively. Pt received CVC and a-line. Levophed was started to increase BP. VSS with T36, BP 120s-160s90s, HR 70s. Pt doing well on vent settings below. Good UOP of 3.1L. ABG at 1200 was 7.48/37.1/385/27.7, serum osmolality 304. Morning labs significant for Na 148, K 3.7.   5/4: Pt noted to have episodes of bradycardia overnight, found to be unresponsive at 7AM. Rapid response was called. Pt was intubated and started on Levophed and Neo; Neo discontinued shortly thereafter. Head CT showed progressive transtentorial herniation. She underwent decompressive R frontotemporoparietal craniectomy for resection of R frontal meningioma without immediate complication. VSS with Bps in 110s/90s, HR 70s, T36.5. SpO2 100 on PRVC FiO2 50%, PEEP 5, plateau 17.   Objective   Blood pressure (!) 167/80, pulse 73, temperature (!) 97.3 F (36.3 C), resp. rate (!) 25, height 5\' 5"  (1.651 m), weight 117 kg, SpO2 98 %.    Vent Mode: PRVC FiO2 (%):  [30 %] 30 % Set Rate:  [24 bmp] 24 bmp Vt Set:  [450 mL] 450 mL PEEP:  [5 cmH20] 5 cmH20 Plateau Pressure:  [11 cmH20-17 cmH20] 16 cmH20   Intake/Output Summary (Last 24 hours) at 10/10/2019 0924 Last data filed at 10/10/2019 0800 Gross per 24 hour  Intake 1688.81 ml  Output 1150 ml  Net 538.81 ml   Filed Weights   09/14/2019 2117 09/13/2019 0122  Weight: 114.3 kg 117 kg    Examination: General: middle aged woman in NAD, resting comfortably. Intubated and unresponsive.  HENT: Atraumatic, normocephalic. Surgical wound boggy, CDI, sutures in place. Significant swelling of R eye. L pupil larger than R, both pupils nonreactive Lungs: mild rhonchi at bilateral bases. No wheezing.  Cardiovascular: RRR no m/r/g Abdomen: Normoactive bowel sounds. Soft, nondistended.  Extremities: No clubbing, cyanosis, or edema. Pulses 2+. No physical deformity.  Neuro: Absent corneal reflex bilaterally. Doll's face nonresponsive. Absent gag  reflex. Absent cough reflex. No motor responses. GU: Foley in place   [facility] Problem list   none  Assessment & Plan:  Ms. Kimberly Daniel is a 64 year old female who presented for progressive headache and confusion found to have 3 cm solitary, right pterional dural based mass c/f meningioma.   Acute hypoxemic respiratory failure: Pt acutely decompensated with bradycardia on morning of 5/4, found unresponsive at 0700. She was emergently intubated without event. CXR confirmed ETT placement. Most recent ABG 5/4 at 1200 was 7.48/37.1/385/27.7. Continues to do well with SpO2 100 on PRVC FiO2 50%, PEEP 5, Plat 17 at 24 bpm. She is still showing respiratory effort, breathing at 32 bpm. Pt unresponsive without sedation currently; pupillary, corneal, gag, cough, and Doll's face all nonresponsive. On 5/5, pt showed respiratory effort with vent set at 24 bpm; today, she did not however demonstrated effort with 10 bpm. Does not meet criteria for brain death. We will continue to trend this. - Continue Vent support  - VAP per protocol  - Levophed discontinued   R pterional mass c/f Meningioma s/p resection and craniectomy: Pt found to have 3 cm solitary, right pterional dural based mass favoring meningioma on brain MRI. Pt underwent emergent resection on 5/4 after a rapid response for hemodynamic instability requiring intubation. Unfortunately, repeat head CT showed original uncal herniation has transformed into subfalcine,  uncal, and cerebellar tonsillar herniation with trapping of the herniated temporal horn. She has been stable but unresponsive post-operatively. ICP controlled with 3% NS at 41mL/hr since 5/4 and also craniectomy. Neurosurgery actively managing.   - Neurosurgery managing - 3% NS 75 mL/hr to be tapered today; mgmt per neurosurgery - s/p craniectomy - Continue Decadron 4mg  q6h per nsgy  - Continue Keppra 500mg  bid  - trend Na per NSGY   Allergic Rhinitis: Long history.  Stable.  - Continue home montelukast   FEN/GI: Pt was NPO for 48 hours with stable sugars. We will start tube feeds today as we do not want to give D10 given Na goal. - trickle Tube feeds today   - Miralax Prn, Colace bid    Best practice:  Diet: TF trickle Pain/Anxiety/Delirium protocol (if indicated): Fentanyl/Dilaudid/Propofol ; none running  VAP protocol (if indicated): Per protocol  DVT prophylaxis: heparin  GI prophylaxis: Protonix Glucose control: SSI Mobility: Intubated, bedrest  Code Status: FULL  Family Communication: Per NSGY, updated 5/4 AM post-operatively Disposition: ICU   Labs   CBC: Recent Labs  Lab 10/24/2019 0557 10/14/2019 1200  WBC 12.5*  --   HGB 16.6* 16.0*  HCT 49.2* 47.0*  MCV 85.6  --   PLT 306  --     Basic Metabolic Panel: Recent Labs  Lab 10/14/2019 0557 10/07/2019 1156 11/03/2019 1200 10/09/2019 1835 10/09/19 0646 10/09/19 1246 10/09/19 1820 10/10/19 0039 10/10/19 0646  NA 138   < > 140   < > 148* 149* 152* 155* 157*  K 3.8  --  3.2*  --  3.7  --   --   --   --   CL 102  --   --   --  111  --   --   --   --   CO2 25  --   --   --  24  --   --   --   --   GLUCOSE 117*  --   --   --  139*  --   --   --   --   BUN 19  --   --   --  17  --   --   --   --   CREATININE 0.81  --   --   --  0.90  --   --   --   --   CALCIUM 8.9  --   --   --  7.9*  --   --   --   --    < > = values in this interval not displayed.   GFR: Estimated Creatinine Clearance: 81.8 mL/min (by C-G formula based on SCr of 0.9 mg/dL). Recent Labs  Lab 10/22/2019 0557  WBC 12.5*    Liver Function Tests: No results for input(s): AST, ALT, ALKPHOS, BILITOT, PROT, ALBUMIN in the last 168 hours. No results for input(s): LIPASE, AMYLASE in the last 168 hours. No results for input(s): AMMONIA in the last 168 hours.  ABG    Component Value Date/Time   PHART 7.482 (H) 10/27/2019 1200   PCO2ART 37.1 10/28/2019 1200   PO2ART 385 (H) 10/17/2019 1200   HCO3 27.7 10/10/2019 1200    TCO2 29 10/29/2019 1200   O2SAT 100.0 10/28/2019 1200     Coagulation Profile: No results for input(s): INR, PROTIME in the last 168 hours.  Cardiac Enzymes: No results for input(s): CKTOTAL, CKMB, CKMBINDEX, TROPONINI in the last 168 hours.  HbA1C: Hgb  A1c MFr Bld  Date/Time Value Ref Range Status  10/06/2019 11:35 AM 6.3 (H) 4.8 - 5.6 % Final    Comment:    (NOTE) Pre diabetes:          5.7%-6.4% Diabetes:              >6.4% Glycemic control for   <7.0% adults with diabetes     CBG: Recent Labs  Lab 10/09/19 1609 10/09/19 1952 10/09/19 2341 10/10/19 0343 10/10/19 0721  GLUCAP 111* 131* 133* 113* 110*    Past Medical History  She,  has a past medical history of COPD (chronic obstructive pulmonary disease) ([place]) and Diabetes mellitus without complication ([place]).   Surgical History    Past Surgical History:  Procedure Laterality Date  . CRANIOTOMY Right 10/17/2019   Procedure: RIGHT FRONTAL CRANIOTOMY FOR MENINGIOMA, WITH PLACEMENT OF BONE FLAP IN ABDOMEN.;  Surgeon: Consuella Lose, MD;  Location: [facility];  Service: Neurosurgery;  Laterality: Right;  right  . KNEE SURGERY    . TUBAL LIGATION       Social History   reports that she has been smoking. She has never used smokeless tobacco. She reports that she does not drink alcohol or use drugs.   Family History   Her family history is not on file.   Allergies No Known Allergies   Home Medications  Prior to Admission medications   Medication Sig Start Date End Date Taking? Authorizing Provider  acetaminophen (TYLENOL) 500 MG tablet Take 1,000 mg by mouth every 6 (six) hours as needed for mild pain.   Yes [provider]  diphenhydrAMINE (BENADRYL) 25 mg capsule Take 25 mg by mouth every 6 (six) hours as needed for itching.   Yes [provider]  ibuprofen (ADVIL,MOTRIN) 200 MG tablet Take 400 mg by mouth every 6 (six) hours as needed. FOR PAIN   Yes [provider]  montelukast  (SINGULAIR) 10 MG tablet Take 10 mg by mouth daily. 08/30/19  Yes [provider]  Multiple Vitamin (MULTIVITAMIN WITH MINERALS) TABS Take 1 tablet by mouth daily. 50 PLUS CENTRUM   Yes [provider]  simvastatin (ZOCOR) 10 MG tablet Take 10 mg by mouth daily. 08/30/19  Yes [provider]  acetaminophen (TYLENOL) 325 MG tablet Take 2 tablets (650 mg total) by mouth every 6 (six) hours as needed. Patient not taking: Reported on 10/02/2019 09/10/15   Sam, Olivia Canter, PA-C  benzonatate (TESSALON) 100 MG capsule Take 1 capsule (100 mg total) by mouth every 8 (eight) hours. Patient not taking: Reported on 10/02/2019 09/10/15   Anne Ng, PA-C  clotrimazole (LOTRIMIN) 1 % cream Apply to affected area 2 times daily Patient not taking: Reported on 10/02/2019 04/14/19   Isla Pence, MD  fluticasone [facility]) 50 MCG/ACT nasal spray Place 2 sprays into both nostrils daily. Patient not taking: Reported on 10/02/2019 09/10/15   Sam, Olivia Canter, PA-C  ondansetron (ZOFRAN ODT) 4 MG disintegrating tablet Take 1 tablet (4 mg total) by mouth every 8 (eight) hours as needed for nausea or vomiting. Patient not taking: Reported on 10/02/2019 09/10/15   Sam, Olivia Canter, PA-C  Pramoxine HCl 1 % CREA Apply 1 inch topically 4 (four) times daily. Patient not taking: Reported on 10/02/2019 04/14/19   Isla Pence, MD     Ivin Poot, MS4

## 2019-10-10 NOTE — Progress Notes (Signed)
  NEUROSURGERY PROGRESS NOTE   No changes overnight  EXAM:  BP (!) 167/80   Pulse 73   Temp (!) 97.3 F (36.3 C)   Resp (!) 25   Ht 5\' 5"  (1.651 m)   Wt 117 kg   SpO2 98%   BMI 42.92 kg/m   Intubated No sedation running not triggering vent Right pupil 21mm, nonreactive, left pupil 42mm nonreactive Crani incision: boggy, no drainage. Staples in place Right>L periorbital edema  IMPRESSION/PLAN 64 y.o. female POD2 emergent crani for resection of meningioma. Remains intubated, no sedation running. No change overnight neurologically. Prognosis poor.  - Na 157 today. Decreased from 755cc/hr to 30cc/hr - continue supportive care - will touch base with family again today

## 2019-10-10 NOTE — Progress Notes (Signed)
Initial Nutrition Assessment  DOCUMENTATION CODES:   Morbid obesity  INTERVENTION:   Vital High Protein @ 20  (480 ml/day) via OG tube  Recommend advance to goal if in line with goals of care: Vital High Protein @ 50 ml/hr  30 ml Prostat BID  Provides: 1400 kcal, 135 grams protein, and 1003 ml free water.    NUTRITION DIAGNOSIS:   Inadequate oral intake related to inability to eat as evidenced by NPO status.  GOAL:   Provide needs based on ASPEN/SCCM guidelines  MONITOR:   TF tolerance, Labs  REASON FOR ASSESSMENT:   Consult, Ventilator Enteral/tube feeding initiation and management  ASSESSMENT:   Pt with PMH of gestational DM and allergic rhinitis who was admitted 4/28 with 1 week of headache, gait instability, and confusion. Per CT pt with 3 cm meningioma with significant associated vasogenic edema with 14 mm midline shift.    4/29 surgery was delayed as pt was not NPO 5/4 pt with bradycardia and unresponsiveness; tx to ICU and intubated; pt went for emergent decompressive craniectomy/resection of tumor. Pt now with minimal brainstem function. Prognosis poor.    Per CCM plan to start trickle feeds to avoid hypoglycemia.    Patient is currently intubated on ventilator support MV: 10.6 L/min Temp (24hrs), Avg:98.5 F (36.9 C), Min:96.6 F (35.9 C), Max:100.4 F (38 C)  Medications reviewed and include: decadron Hypertonic saline  Labs reviewed: Na 157 (H) CBG's: [id number]    Diet Order:   Diet Order            Diet NPO time specified  Diet effective now              EDUCATION NEEDS:   No education needs have been identified at this time  Skin:  Skin Assessment: Reviewed RN Assessment(abd and head incision)  Last BM:  4/28  Height:   Ht Readings from Last 1 Encounters:  10/04/2019 5\' 5"  (1.651 m)    Weight:   Wt Readings from Last 1 Encounters:  09/15/2019 117 kg    Ideal Body Weight:  56.8 kg  BMI:  Body mass index is 42.92  kg/m.  Estimated Nutritional Needs:   Kcal:  1300-1600  Protein:  114-142 grams  Fluid:  > 1.5 L/day  Lockie Pares., RD, LDN, CNSC See [facility] for contact information

## 2019-10-10 NOTE — Addendum Note (Signed)
Addendum  created 10/10/19 0809 by Nolon Nations, MD   Clinical Note Signed, Intraprocedure Blocks edited

## 2019-10-10 NOTE — Plan of Care (Signed)
Pt unable to wean on ventilator. Started TF.

## 2019-10-10 NOTE — Progress Notes (Signed)
Sodium 157. Notified [place], [place]. No new orders at this time.

## 2019-10-11 LAB — BASIC METABOLIC PANEL
Anion gap: 10 (ref 5–15)
BUN: 32 mg/dL — ABNORMAL HIGH (ref 8–23)
CO2: 26 mmol/L (ref 22–32)
Calcium: 8.4 mg/dL — ABNORMAL LOW (ref 8.9–10.3)
Chloride: 123 mmol/L — ABNORMAL HIGH (ref 98–111)
Creatinine, Ser: 0.98 mg/dL (ref 0.44–1.00)
GFR calc Af Amer: >60 mL/min (ref >60)
GFR calc non Af Amer: >60 mL/min (ref >60)
Glucose, Bld: 117 mg/dL — ABNORMAL HIGH (ref 70–99)
Potassium: 3.9 mmol/L (ref 3.5–5.1)
Sodium: 159 mmol/L — ABNORMAL HIGH (ref 135–145)

## 2019-10-11 LAB — GLUCOSE, CAPILLARY
Glucose-Capillary: 102 mg/dL — ABNORMAL HIGH (ref 70–99)
Glucose-Capillary: 108 mg/dL — ABNORMAL HIGH (ref 70–99)
Glucose-Capillary: 120 mg/dL — ABNORMAL HIGH (ref 70–99)
Glucose-Capillary: 121 mg/dL — ABNORMAL HIGH (ref 70–99)
Glucose-Capillary: 134 mg/dL — ABNORMAL HIGH (ref 70–99)
Glucose-Capillary: 140 mg/dL — ABNORMAL HIGH (ref 70–99)

## 2019-10-11 LAB — SODIUM: Sodium: 158 mmol/L — ABNORMAL HIGH (ref 135–145)

## 2019-10-11 MED ORDER — PANTOPRAZOLE SODIUM 40 MG PO PACK
40.0000 mg | PACK | Freq: Every day | ORAL | Status: DC
  Administered 2019-10-12 – 2019-10-17 (×6): 40 mg
  Filled 2019-10-11 (×6): qty 20

## 2019-10-11 NOTE — Progress Notes (Signed)
  Admitted 4/28 with 3 cm right meningioma with edema and 40 mm midline shift and mild uncal herniation. On 5/4 developed bradycardia and coma, with bilateral dilated pupils, head CT showing worse herniation.Underwent right craniectomy and resection of right frontal meningioma  Vitals:   10/11/19 0800 10/11/19 0900  BP: 132/79 137/75  Pulse: 79 77  Resp: 17 18  Temp: 98.4 F (36.9 C) 98.2 F (36.8 C)  SpO2: 100% 96%   Diuresed with Lasix, off 3% saline  She is critically ill, intubated, remains comatose, bilateral orbital edema has decreased, pupils remain dilated not reactive to light, right 5 mm, left 3 mm, absent corneal and conjunctival reflexes, doll's eye present, bilateral ventilated breath sounds, she is actually weaning on pressure support today, 1+ edema.  X-ray abdomen reviewed which shows 2 OG tubes one at the GE junction and one in the stomach. Labs show hypernatremia and hyperchloremia-induced by 3% saline  Impression/plan Right meningioma with cerebral edema and brain herniation status post resection -exam remains consistent with extensive brainstem injury although she does have doll's eye and respiratory trigger. -Continue Decadron, Keppra and supportive care -3% saline has been stopped  Acute respiratory failure -tolerate some spontaneous breathing but doubt that she will be able to protect airway if extubated  Defer family communication to neurosurgery, they are contemplating withdrawal of life support  The patient is critically ill with multiple organ systems failure and requires high complexity decision making for assessment and support, frequent evaluation and titration of therapies, application of advanced monitoring technologies and extensive interpretation of multiple databases. Critical Care Time devoted to patient care services described in this note independent of APP/resident  time is 31 minutes.   Kimberly Sato Elsworth Soho MD

## 2019-10-11 NOTE — Progress Notes (Signed)
  NEUROSURGERY PROGRESS NOTE   No issues overnight.   EXAM:  BP 132/79   Pulse 79   Temp 98.4 F (36.9 C)   Resp 17   Ht 5\' 5"  (1.651 m)   Wt 117 kg   SpO2 100%   BMI 42.92 kg/m   No eye opening Right pupil 47mm non-reactive Left pupil 96mm, non-reactive (-) Doll's eyes (-) cough/gag Breathes over vent No motor responses to central pain Wound c/d/i  IMPRESSION:  64 y.o. female POD# 3 s/p decompressive craniectomy/tumor resection with unchanged exam and minimal brainstem function. Prognosis remains dismal.   PLAN: - Cont current mgmt - Family to decide about change of code status and transition to comfort care/one-way extubation.

## 2019-10-11 NOTE — Progress Notes (Signed)
NAME:  Kimberly Daniel, MRN:  [id number], DOB:  12/28/1955, LOS: 9 ADMISSION DATE:  09/15/2019, CONSULTATION DATE:  10/30/2019 REFERRING MD:  Kathyrn Sheriff MD, CHIEF COMPLAINT:  Meningioma   Brief History   Kimberly Daniel is a 64 yo F with a h/o gestational DM and allergic rhinitis who presented to [facility] ED with 1 week of headache, gait instability, and confusion. She had no prior history of consistent headaches. She began to have memory difficulties, including forgetting where she was driving, misplacing meaningful items, etc. This prompted her to seek care in the ED. On presentation, vitals were stable. She did not have changes in vision, dizziness, numbness, tingling, or aphasia. Head CT revealed 3 cm solitary, right  Pterional dural-based mass, likely meningioma, with significant associated vasogenic edema and resulting 14 mm of midline shift. She was admitted to the [facility] Neurosurgery service. Her surgery on 4/29 was delayed as she was not NPO prior. She continued to do well until 5/4, when she was noted to have episodes of bradycardia overnight. She was then found to be unresponsive at 7AM; Rapid response was called, pt was emergently transferred to ICU, intubated, and PCCM was consulted.   Past Medical History  Gestational DM  Allergic Rhinitis   Significant Hospital Events   4/27: Admitted  4/29: Neurosurgery delayed d/t not NPO 5/4: Acute decompensation and intubation  5/4: Decompressive R frontotemporoparietal craniectomy for resection of R frontal meningioma; placement of bone flap into subcutaneous abdominal pocket  Consults:  Neurosurgery (primary) PCCM  Procedures:  5/4: ETT emergently  5/4: Decompressive R frontotemporoparietal craniectomy for resection of R frontal meningioma; placement of bone flap into subcutaneous abdominal pocket 5/4: CVC placement, RIJ  5/4: A line  Significant Diagnostic Tests:  4/27 Head CT Noncon:  1. Right frontal lobe severe vasogenic edema  and 10 mm of leftward midline shift, effacement of the basilar cisterns and impending subfalcine herniation of the cingulate gyrus. 2. Partially calcified mass abutting the right frontal lobe is likely a meningioma. While this may be a source of the right frontal edema, there also could be an intraparenchymal lesion not visible by CT. MRI of the head with and without contrast might be helpful for further evaluation. 3. No acute hemorrhage.  4/28: Brain MRI W WO Con:  3 cm solitary, right pterional dural based mass favoring meningioma. There is contiguous profound vasogenic edema with 14 mm of midline shift and right uncal herniation.  5/4 CT Head Noncon:  Stable size of meningioma more fully characterized on prior MRI. There is persistent marked edema and mass effect, with further progression since 09/12/2019. This includes subfalcine, uncal, and cerebellar tonsillar herniation with trapping of the herniated temporal horn. There may be mild trapping of the posterior left lateral ventricle.  5/4: CXR:  1. Tube and catheter positions as described without pneumothorax. Note that the endotracheal tube tip is at the carina. Advise withdrawing endotracheal tube approximately 2.5-3 cm. 2. Consolidation left lower lobe with small left pleural effusion. Concern for potential pneumonia or aspiration in this area. 3.  Right upper lobe atelectatic change. 4.  Stable cardiac silhouette.  Micro Data:  4/29: MRSA PCR negative  Antimicrobials:  5/4: Ancef 2g preop ppx   Interim history/subjective:  5/7: NAE. Pt unresponsive on no sedation. She received Lasix 40 mg x1 yesteerday with good response, UOP 2L (net -1.2L). VSS. Now breathing on PSV +5. Dolls eye showed mild response today with nystagmus. Morning labs unremarkable.   5/6:  NAE. Pt unresponsive on no sedation. Levophed now titrated off. VSS T36, BP 120s/70s, HR 60s, SpO2 100 on PRVC 400/30%/+5.  Pt has not been on diet and glucose  110s-130s.  Pupillary, corneal, gag, cough, and Doll's face all nonresponsive. GCS 2T. Pt notably had respiratory drive at 24 bpm yesterday and did not today. When rate was reduced to 10 bpm, pt demonstrated respiratory effort; does not meet criteria for brain death.   5/5: NAE post-operatively. Pt received CVC and a-line. Levophed was started to increase BP. VSS with T36, BP 120s-160s90s, HR 70s. Pt doing well on vent settings below. Good UOP of 3.1L. ABG at 1200 was 7.48/37.1/385/27.7, serum osmolality 304. Morning labs significant for Na 148, K 3.7.   5/4: Pt noted to have episodes of bradycardia overnight, found to be unresponsive at 7AM. Rapid response was called. Pt was intubated and started on Levophed and Neo; Neo discontinued shortly thereafter. Head CT showed progressive transtentorial herniation. She underwent decompressive R frontotemporoparietal craniectomy for resection of R frontal meningioma without immediate complication. VSS with Bps in 110s/90s, HR 70s, T36.5. SpO2 100 on PRVC FiO2 50%, PEEP 5, plateau 17.   Objective   Blood pressure 132/79, pulse 79, temperature 98.4 F (36.9 C), resp. rate 17, height 5\' 5"  (1.651 m), weight 117 kg, SpO2 100 %.    Vent Mode: PSV;CPAP FiO2 (%):  [30 %-50 %] 40 % Set Rate:  [18 bmp] 18 bmp Vt Set:  [450 mL] 450 mL PEEP:  [5 cmH20-8 cmH20] 5 cmH20 Pressure Support:  [10 cmH20] 10 cmH20 Plateau Pressure:  [13 cmH20-18 cmH20] 14 cmH20   Intake/Output Summary (Last 24 hours) at 10/11/2019 0827 Last data filed at 10/11/2019 0557 Gross per 24 hour  Intake 619.64 ml  Output 2000 ml  Net -1380.36 ml   Filed Weights   09/30/2019 2117 10/03/19 0122  Weight: 114.3 kg 117 kg    Examination: General: middle aged woman in NAD, resting comfortably. Intubated and unresponsive.  HENT: Atraumatic, normocephalic. Surgical wound boggy, CDI, sutures in place. Significant swelling of R eye. L pupil larger than R, both pupils nonreactive Lungs: mild rhonchi at  bilateral bases. No wheezing.  Cardiovascular: RRR no m/r/g Abdomen: Normoactive bowel sounds. Soft, nondistended.  Extremities: No clubbing, cyanosis. 1+ bilateral edema. Pulses 2+. No physical deformity.  Neuro: Absent pupillary, corneal reflex bilaterally. Some nystagmus with Doll's eye. Absent gag reflex. Absent cough reflex. No motor responses. GU: Cath in place  [facility] Problem list   none  Assessment & Plan:  Ms. Kalaysia Barbuto is a 64 year old female who presented for progressive headache and confusion found to have 3 cm solitary, right pterional dural based mass c/f meningioma.   Acute hypoxemic respiratory failure: Pt acutely decompensated with bradycardia on morning of 5/4, found unresponsive at 0700. She was emergently intubated without event. CXR confirmed ETT placement. Most recent ABG 5/4 at 1200 was 7.48/37.1/385/27.7. Continues to do well with SpO2 100 on PRVC FiO2 50%, PEEP 5, Plat 17 at 24 bpm. She is still showing respiratory effort, breathing at 32 bpm. Pt unresponsive without sedation currently; pupillary, corneal, gag, cough unresponsive however some nystagmus with Doll's face today. On 5/5, pt showed respiratory effort with vent set at 24 bpm; she received Lasix with good response and she is now satting well on PSV +5. Still clinically has volume to give. Certainly has significant brainstem injury but is showing brainstem fx with respiratory drive and now Doll's eye.  -  Continue Vent support  - VAP per protocol  - Lasix 20 mg IV once; hypertonic saline now discontinued   R pterional mass c/f Meningioma s/p resection and craniectomy: Pt found to have 3 cm solitary, right pterional dural based mass favoring meningioma on brain MRI. Pt underwent emergent resection on 5/4 after a rapid response for hemodynamic instability requiring intubation. Unfortunately, repeat head CT showed original uncal herniation has transformed into subfalcine, uncal, and cerebellar  tonsillar herniation with trapping of the herniated temporal horn. She has been stable but unresponsive post-operatively. ICP controlled with craniectomy; 3% NS discontinued. Neurosurgery actively managing.   - Neurosurgery managing - 3% NS now discontinued  - s/p craniectomy - Continue Decadron 4mg  q6h per nsgy  - Continue Keppra 500mg  bid  - trend Na per NSGY   Allergic Rhinitis: Long history. Stable.   FEN/GI: Pt was NPO for 48 hours with stable sugars. Tube feeds began 5/6 and tolerting well.  - trickle Tube feeds  - Miralax Prn, Colace bid    Best practice:  Diet: TF trickle Pain/Anxiety/Delirium protocol (if indicated): Fentanyl/Dilaudid/Propofol ; none running  VAP protocol (if indicated): Per protocol  DVT prophylaxis: heparin  GI prophylaxis: Protonix Glucose control: SSI Mobility: Intubated, bedrest  Code Status: FULL  Family Communication: Per NSGY, updated 5/4 AM post-operatively Disposition: ICU   Labs   CBC: Recent Labs  Lab 11/04/2019 0557 10/22/2019 1200  WBC 12.5*  --   HGB 16.6* 16.0*  HCT 49.2* 47.0*  MCV 85.6  --   PLT 306  --     Basic Metabolic Panel: Recent Labs  Lab 10/14/2019 0557 10/05/2019 1156 10/07/2019 1200 10/20/2019 1835 10/09/19 0646 10/09/19 1246 10/10/19 0646 10/10/19 1143 10/10/19 1825 10/11/19 0128 10/11/19 0517  NA 138   < > 140   < > 148*   < > 157* 158* 156* 158* 159*  K 3.8  --  3.2*  --  3.7  --   --   --   --   --  3.9  CL 102  --   --   --  111  --   --   --   --   --  123*  CO2 25  --   --   --  24  --   --   --   --   --  26  GLUCOSE 117*  --   --   --  139*  --   --   --   --   --  117*  BUN 19  --   --   --  17  --   --   --   --   --  32*  CREATININE 0.81  --   --   --  0.90  --   --   --   --   --  0.98  CALCIUM 8.9  --   --   --  7.9*  --   --   --   --   --  8.4*   < > = values in this interval not displayed.   GFR: Estimated Creatinine Clearance: 75.1 mL/min (by C-G formula based on SCr of 0.98 mg/dL). Recent  Labs  Lab 10/05/2019 0557  WBC 12.5*    Liver Function Tests: No results for input(s): AST, ALT, ALKPHOS, BILITOT, PROT, ALBUMIN in the last 168 hours. No results for input(s): LIPASE, AMYLASE in the last 168 hours. No results for input(s): AMMONIA in the last  168 hours.  ABG    Component Value Date/Time   PHART 7.482 (H) 11/02/2019 1200   PCO2ART 37.1 10/09/2019 1200   PO2ART 385 (H) 10/31/2019 1200   HCO3 27.7 10/05/2019 1200   TCO2 29 11/01/2019 1200   O2SAT 100.0 10/07/2019 1200     Coagulation Profile: No results for input(s): INR, PROTIME in the last 168 hours.  Cardiac Enzymes: No results for input(s): CKTOTAL, CKMB, CKMBINDEX, TROPONINI in the last 168 hours.  HbA1C: Hgb A1c MFr Bld  Date/Time Value Ref Range Status  10/06/2019 11:35 AM 6.3 (H) 4.8 - 5.6 % Final    Comment:    (NOTE) Pre diabetes:          5.7%-6.4% Diabetes:              >6.4% Glycemic control for   <7.0% adults with diabetes     CBG: Recent Labs  Lab 10/10/19 1609 10/10/19 1918 10/10/19 2326 10/11/19 0330 10/11/19 0807  GLUCAP 131* 147* 125* 102* 134*    Past Medical History  She,  has a past medical history of COPD (chronic obstructive pulmonary disease) ([place]) and Diabetes mellitus without complication ([place]).   Surgical History    Past Surgical History:  Procedure Laterality Date  . CRANIOTOMY Right 10/30/2019   Procedure: RIGHT FRONTAL CRANIOTOMY FOR MENINGIOMA, WITH PLACEMENT OF BONE FLAP IN ABDOMEN.;  Surgeon: Consuella Lose, MD;  Location: [place];  Service: Neurosurgery;  Laterality: Right;  right  . KNEE SURGERY    . TUBAL LIGATION       Social History   reports that she has been smoking. She has never used smokeless tobacco. She reports that she does not drink alcohol or use drugs.   Family History   Her family history is not on file.   Allergies No Known Allergies   Home Medications  Prior to Admission medications   Medication Sig Start Date End Date Taking?  Authorizing Provider  acetaminophen (TYLENOL) 500 MG tablet Take 1,000 mg by mouth every 6 (six) hours as needed for mild pain.   Yes [provider]  diphenhydrAMINE (BENADRYL) 25 mg capsule Take 25 mg by mouth every 6 (six) hours as needed for itching.   Yes [provider]  ibuprofen (ADVIL,MOTRIN) 200 MG tablet Take 400 mg by mouth every 6 (six) hours as needed. FOR PAIN   Yes [provider]  montelukast (SINGULAIR) 10 MG tablet Take 10 mg by mouth daily. 08/30/19  Yes [provider]  Multiple Vitamin (MULTIVITAMIN WITH MINERALS) TABS Take 1 tablet by mouth daily. 50 PLUS CENTRUM   Yes [provider]  simvastatin (ZOCOR) 10 MG tablet Take 10 mg by mouth daily. 08/30/19  Yes [provider]  acetaminophen (TYLENOL) 325 MG tablet Take 2 tablets (650 mg total) by mouth every 6 (six) hours as needed. Patient not taking: Reported on 10/02/2019 09/10/15   Sam, Olivia Canter, PA-C  benzonatate (TESSALON) 100 MG capsule Take 1 capsule (100 mg total) by mouth every 8 (eight) hours. Patient not taking: Reported on 10/02/2019 09/10/15   Anne Ng, PA-C  clotrimazole (LOTRIMIN) 1 % cream Apply to affected area 2 times daily Patient not taking: Reported on 10/02/2019 04/14/19   Isla Pence, MD  fluticasone [facility] 50 MCG/ACT nasal spray Place 2 sprays into both nostrils daily. Patient not taking: Reported on 10/02/2019 09/10/15   Sam, Olivia Canter, PA-C  ondansetron (ZOFRAN ODT) 4 MG disintegrating tablet Take 1 tablet (4 mg total) by  mouth every 8 (eight) hours as needed for nausea or vomiting. Patient not taking: Reported on 10/02/2019 09/10/15   Sam, Olivia Canter, PA-C  Pramoxine HCl 1 % CREA Apply 1 inch topically 4 (four) times daily. Patient not taking: Reported on 10/02/2019 04/14/19   Isla Pence, MD     Ivin Poot, MS4

## 2019-10-12 ENCOUNTER — Inpatient Hospital Stay (HOSPITAL_COMMUNITY): Payer: Self-pay

## 2019-10-12 LAB — BASIC METABOLIC PANEL
Anion gap: 10 (ref 5–15)
BUN: 36 mg/dL — ABNORMAL HIGH (ref 8–23)
CO2: 26 mmol/L (ref 22–32)
Calcium: 8.5 mg/dL — ABNORMAL LOW (ref 8.9–10.3)
Chloride: 120 mmol/L — ABNORMAL HIGH (ref 98–111)
Creatinine, Ser: 0.87 mg/dL (ref 0.44–1.00)
GFR calc Af Amer: >60 mL/min (ref >60)
GFR calc non Af Amer: >60 mL/min (ref >60)
Glucose, Bld: 136 mg/dL — ABNORMAL HIGH (ref 70–99)
Potassium: 3.7 mmol/L (ref 3.5–5.1)
Sodium: 156 mmol/L — ABNORMAL HIGH (ref 135–145)

## 2019-10-12 LAB — GLUCOSE, CAPILLARY
Glucose-Capillary: 108 mg/dL — ABNORMAL HIGH (ref 70–99)
Glucose-Capillary: 123 mg/dL — ABNORMAL HIGH (ref 70–99)
Glucose-Capillary: 129 mg/dL — ABNORMAL HIGH (ref 70–99)
Glucose-Capillary: 131 mg/dL — ABNORMAL HIGH (ref 70–99)
Glucose-Capillary: 131 mg/dL — ABNORMAL HIGH (ref 70–99)
Glucose-Capillary: 134 mg/dL — ABNORMAL HIGH (ref 70–99)

## 2019-10-12 LAB — CBC
HCT: 40 % (ref 36.0–46.0)
Hemoglobin: 12.3 g/dL (ref 12.0–15.0)
MCH: 28.9 pg (ref 26.0–34.0)
MCHC: 30.8 g/dL (ref 30.0–36.0)
MCV: 93.9 fL (ref 80.0–100.0)
Platelets: 194 10*3/uL (ref 150–400)
RBC: 4.26 MIL/uL (ref 3.87–5.11)
RDW: 15.6 % — ABNORMAL HIGH (ref 11.5–15.5)
WBC: 13.9 10*3/uL — ABNORMAL HIGH (ref 4.0–10.5)
nRBC: 0.1 % (ref 0.0–0.2)

## 2019-10-12 MED ORDER — POTASSIUM CHLORIDE 20 MEQ/15ML (10%) PO SOLN
40.0000 meq | Freq: Once | ORAL | Status: AC
  Administered 2019-10-12: 40 meq
  Filled 2019-10-12: qty 30

## 2019-10-12 NOTE — Progress Notes (Signed)
K 3.7 Replaced per protocol

## 2019-10-12 NOTE — Progress Notes (Signed)
Subjective: NAEs o/n  Objective: Vital signs in last 24 hours: Temp:  [95.9 F (35.5 C)-97.9 F (36.6 C)] 96.4 F (35.8 C) (05/08 0800) Pulse Rate:  [58-81] 61 (05/08 0800) Resp:  [18-21] 18 (05/08 0800) BP: (119-152)/(66-90) 146/76 (05/08 0800) SpO2:  [94 %-100 %] 99 % (05/08 0800) FiO2 (%):  [30 %-40 %] 40 % (05/08 0800)  Intake/Output from previous day: 05/07 0701 - 05/08 0700 In: 500 [NG/GT:500] Out: 1075 [Urine:1075] Intake/Output this shift: Total I/O In: 40 [NG/GT:40] Out: -   Eyes closed, nonreactive pupils bilaterally R 5 mm, L 4 mm Extensor posturing in legs Triple flexion response in legs Incision c/d Flap flat  Lab Results: Recent Labs    10/12/19 0433  WBC 13.9*  HGB 12.3  HCT 40.0  PLT 194   BMET Recent Labs    10/11/19 0517 10/12/19 0433  NA 159* 156*  K 3.9 3.7  CL 123* 120*  CO2 26 26  GLUCOSE 117* 136*  BUN 32* 36*  CREATININE 0.98 0.87  CALCIUM 8.4* 8.5*    Studies/Results: DG Abd Portable 1V  Result Date: 10/10/2019 CLINICAL DATA:  OG tube placement EXAM: PORTABLE ABDOMEN - 1 VIEW COMPARISON:  None. FINDINGS: There appear to be 2 esophageal catheters, 1 tip projects over the GE junction, the other projects over the mid gastric region. IMPRESSION: Apparent 2 esophageal catheters, 1 tip overlies the GE junction. The other tip overlies the mid stomach. Electronically Signed   By: Donavan Foil M.D.   On: 10/10/2019 23:20    Assessment/Plan: 64 yo F who suffered herniation from extensive brain edema s/p hemicraniectomy - prognosis for meaningful functional recovery remains dismal. - family deciding on goals of care - cont Keppra, dexamethasone, hep subQ - supportive care per ICU team-- assistance appreciated   Vallarie Mare 10/12/2019, 9:56 AM

## 2019-10-12 NOTE — Progress Notes (Signed)
Assumed care of pt at 1900. Pts doesn't open eyes, right pupil 105mm non-reactive, left pupil 51mm, non-reactive, no cough or gag, no motor responses to central pain, and pt hasn't breathed over the vent tonight. Pt does demonstrate posturing sometimes when stimulated by oral care and repositioning. Pt bath completed and foley care done. Will continue to monitor.

## 2019-10-12 NOTE — Progress Notes (Addendum)
NAME:  Kimberly Daniel, MRN:  [id number], DOB:  Nov 26, 1955, LOS: 21 ADMISSION DATE:  09/23/2019, CONSULTATION DATE:  10/22/2019 REFERRING MD:  Kathyrn Sheriff MD, CHIEF COMPLAINT:  Meningioma   Brief History   Ms. Kimberly Daniel is a 64 yo F with a h/o gestational DM and allergic rhinitis who presented to [facility] ED with 1 week of headache, gait instability, and confusion. She had no prior history of consistent headaches. She began to have memory difficulties, including forgetting where she was driving, misplacing meaningful items, etc. This prompted her to seek care in the ED. On presentation, vitals were stable. She did not have changes in vision, dizziness, numbness, tingling, or aphasia. Head CT revealed 3 cm solitary, right  Pterional dural-based mass, likely meningioma, with significant associated vasogenic edema and resulting 14 mm of midline shift. She was admitted to the [facility] Neurosurgery service. Her surgery on 4/29 was delayed as she was not NPO prior. She continued to do well until 5/4, when she was noted to have episodes of bradycardia overnight. She was then found to be unresponsive at 7AM; Rapid response was called, pt was emergently transferred to ICU, intubated, and PCCM was consulted.   Past Medical History  Gestational DM  Allergic Rhinitis   Significant Hospital Events   4/27: Admitted  4/29: Neurosurgery delayed d/t not NPO 5/4: Acute decompensation and intubation  5/4: Decompressive R frontotemporoparietal craniectomy for resection of R frontal meningioma; placement of bone flap into subcutaneous abdominal pocket 5/8 poor neuro status.  Primary team talking to family regarding goals of care.  Consults:  Neurosurgery (primary) PCCM  Procedures:  5/4: ETT emergently  5/4: Decompressive R frontotemporoparietal craniectomy for resection of R frontal meningioma; placement of bone flap into subcutaneous abdominal pocket 5/4: CVC placement, RIJ  5/4: A line  Significant  Diagnostic Tests:  4/27 Head CT Noncon:  1. Right frontal lobe severe vasogenic edema and 10 mm of leftward midline shift, effacement of the basilar cisterns and impending subfalcine herniation of the cingulate gyrus. 2. Partially calcified mass abutting the right frontal lobe is likely a meningioma. While this may be a source of the right frontal edema, there also could be an intraparenchymal lesion not visible by CT. MRI of the head with and without contrast might be helpful for further evaluation. 3. No acute hemorrhage.  4/28: Brain MRI W WO Con:  3 cm solitary, right pterional dural based mass favoring meningioma. There is contiguous profound vasogenic edema with 14 mm of midline shift and right uncal herniation.  5/4 CT Head Noncon:  Stable size of meningioma more fully characterized on prior MRI. There is persistent marked edema and mass effect, with further progression since 09/15/2019. This includes subfalcine, uncal, and cerebellar tonsillar herniation with trapping of the herniated temporal horn. There may be mild trapping of the posterior left lateral ventricle.  Micro Data:  4/29: MRSA PCR negative  Antimicrobials:  5/4: Ancef 2g preop ppx   Interim history/subjective:  5/7: NAE. Pt unresponsive on no sedation. She received Lasix 40 mg x1 yesteerday with good response, UOP 2L (net -1.2L). VSS. Now breathing on PSV +5. Dolls eye showed mild response today with nystagmus. Morning labs unremarkable.   Objective   Blood pressure (Abnormal) 146/76, pulse 61, temperature (Abnormal) 96.4 F (35.8 C), resp. rate 18, height 5\' 5"  (1.651 m), weight 117 kg, SpO2 99 %.    Vent Mode: PRVC FiO2 (%):  [30 %-40 %] 40 % Set Rate:  [18 bmp]  18 bmp Vt Set:  [450 mL] 450 mL PEEP:  [5 cmH20] 5 cmH20 Pressure Support:  [10 cmH20] 10 cmH20 Plateau Pressure:  [15 cmH20-18 cmH20] 18 cmH20   Intake/Output Summary (Last 24 hours) at 10/12/2019 0912 Last data filed at 10/12/2019  0800 Gross per 24 hour  Intake 480 ml  Output 1075 ml  Net -595 ml   Filed Weights   09/21/2019 2117 09/29/2019 0122  Weight: 114.3 kg 117 kg   General: This is a 64 year old comatose black female who remains on full ventilatory support HEENT normocephalic atraumatic pupils minimally reactive orally intubated mucous membranes moist Pulmonary: Clear to auscultation equal chest rise on mechanically assisted breath Cardiac: Regular rate and rhythm without murmur rub or gallop Abdomen: Soft nontender no organomegaly Extremities: Warm dry brisk cap refill Neuro: Unresponsive, intermittent myoclonus, otherwise no response no gag appreciated.  No cough currently  Resolved Hospital Problem list   none  Assessment & Plan:  Ms. Kimberly Daniel is a 64 year old female who presented for progressive headache and confusion found to have 3 cm solitary, right pterional dural based mass c/f meningioma.   Right meningioma with cerebral edema, and brain herniation status post resection -Remains comatose with minimal to no improvement -Hypertonic saline discontinued Plan Continue serial neuro checks Supportive care Goals of care per neurosurgery Continue Decadron per surgery Continue Keppra per surgery  Ventilator dependence in the setting of inability to protect airway from devastating brain injury Plan  Fluid electrolyte imbalance: Hypernatremia, hyperchloremia Plan No intervention currently, had been on hypertonic saline, we will not replace free water at this time given cerebral edema   Best practice:  Diet: TF trickle Pain/Anxiety/Delirium protocol (if indicated): Fentanyl/Dilaudid/Propofol ; none running  VAP protocol (if indicated): Per protocol  DVT prophylaxis: heparin  GI prophylaxis: Protonix Glucose control: SSI Mobility: Intubated, bedrest  Code Status: FULL  Family Communication: Per NSGY, updated 5/4 AM post-operatively Disposition: ICU she is full code currently.  Would  limit interventions given poor prognosis.  Deferring goals of care discussion with neurosurgery, however we can assist with this if needed.  My cct 32 minutes. Erick Colace ACNP-BC [place] Pager # [phone] OR # [phone] if no answer  Attending note: I have seen and examined the patient. History, labs and imaging reviewed.  64 year old with gestational diabetes, rhinitis admitted with right frontal meningioma with edema s/p decompressive craniectomy.  Blood pressure (!) 146/76, pulse 61, temperature (!) 96.4 F (35.8 C), resp. rate 18, height 5\' 5"  (1.651 m), weight 117 kg, SpO2 99 %. Gen:      No acute distress HEENT:  EOMI, sclera anicteric Neck:     No masses; no thyromegaly, ETT Lungs:    Clear to auscultation bilaterally; normal respiratory effort CV:         Regular rate and rhythm; no murmurs Abd:      + bowel sounds; soft, non-tender; no palpable masses, no distension Ext:    No edema; adequate peripheral perfusion Skin:      Warm and dry; no rash Neuro: alert and oriented x 3 Psych: normal mood and affect   Labs/Imaging personally reviewed, significant for Sodium 156, BUN/creatinine 36/0.87, WBC 13.9 No new imaging  Assessment/plan: Meningioma with edema, herniation s/p hemicraniectomy Prognosis for meaningful recovery is poor Continue goals of care with primary team On Keppra, dexamethasone, off hypertonic saline  Acute hypoxic respiratory failure secondary to encephalopathy Continue full vent support for now.  No plans on extubation.  The patient is critically ill with multiple organ systems failure and requires high complexity decision making for assessment and support, frequent evaluation and titration of therapies, application of advanced monitoring technologies and extensive interpretation of multiple databases.  Critical care time - 35 mins. This represents my time independent of the NPs time taking care of the pt.  Marshell Garfinkel  MD [facility] 10/12/2019, 10:48 AM

## 2019-10-13 DIAGNOSIS — Z515 Encounter for palliative care: Secondary | ICD-10-CM

## 2019-10-13 DIAGNOSIS — Z7189 Other specified counseling: Secondary | ICD-10-CM

## 2019-10-13 LAB — BASIC METABOLIC PANEL
Anion gap: 9 (ref 5–15)
BUN: 36 mg/dL — ABNORMAL HIGH (ref 8–23)
CO2: 27 mmol/L (ref 22–32)
Calcium: 8.9 mg/dL (ref 8.9–10.3)
Chloride: 119 mmol/L — ABNORMAL HIGH (ref 98–111)
Creatinine, Ser: 0.85 mg/dL (ref 0.44–1.00)
GFR calc Af Amer: >60 mL/min (ref >60)
GFR calc non Af Amer: >60 mL/min (ref >60)
Glucose, Bld: 158 mg/dL — ABNORMAL HIGH (ref 70–99)
Potassium: 4 mmol/L (ref 3.5–5.1)
Sodium: 155 mmol/L — ABNORMAL HIGH (ref 135–145)

## 2019-10-13 LAB — GLUCOSE, CAPILLARY
Glucose-Capillary: 122 mg/dL — ABNORMAL HIGH (ref 70–99)
Glucose-Capillary: 127 mg/dL — ABNORMAL HIGH (ref 70–99)
Glucose-Capillary: 137 mg/dL — ABNORMAL HIGH (ref 70–99)
Glucose-Capillary: 143 mg/dL — ABNORMAL HIGH (ref 70–99)

## 2019-10-13 MED ORDER — ATROPINE SULFATE 1 MG/ML IJ SOLN
INTRAMUSCULAR | Status: AC
  Filled 2019-10-13: qty 1

## 2019-10-13 MED ORDER — ATROPINE SULFATE 1 MG/10ML IJ SOSY
PREFILLED_SYRINGE | INTRAMUSCULAR | Status: AC
  Filled 2019-10-13: qty 10

## 2019-10-13 NOTE — Consult Note (Signed)
Consultation Note Date: 10/13/2019   Patient Name: Kimberly Daniel  DOB: August 31, 1955  MRN: [id number]  Age / Sex: 64 y.o., female  PCP: Kristen Loader, FNP Referring Physician: Consuella Lose, MD  Reason for Consultation: Establishing goals of care  HPI/Patient Profile: 64 y.o. female  with past medical history of COPD, diabetes admitted on 09/07/2019 with dizziness, right sided headaches, gait instability, memory changes/confusion and found to have right frontal brain mass (likely meningioma) with 23m midline shift and significant vasogenic edema. She was placed on steroids and had emergent surgery 5/4 after an acute decline requiring intubation. She has not had any further signs of improvement and neurological assessment is extremely poor. Prognosis is grim.   Clinical Assessment and Goals of Care: I have reviewed electronic records and diagnostic results and discussed with bedside RN and PCCM. Overall examination is very concerning for poor neurological status and recovery.   I reached out to family contacts listed in chart: I did not reach son MLegrand Comoand when I reached son TDorothea Oglehe handed phone to son JJuanna Cao" I introduced palliative care as a support to them during this time and to assist with making sure they are getting the information needed to make good decisions for their mother. ERandall Hisshas had conversations with medical team but had some questions regarding his mother's status and expectations. He expresses concern that they do not want to "pull the plug" too soon. I explained to him that although she is not brain dead she does have extensive and significant brain damage from swelling and pressure from the mass. I explained concerning symptoms with pupils non-reactive, no gag reflex, and no response without sedation and is in a comatose state. Her brain cannot even control her body temperature  and she is hypothermic requiring heating blanket. I explained these are very severe signs and we would have hoped to see some improvement by now if this were going to improve. In fact I am even more concerned as she seems to be getting worse instead of better.   ERandall Hissagrees that his family should meet with our team so they can all hear this information. DRubbieis married as well so her husband, RJeanell Sparrow should be a part of these conversations although he has health issues himself that at times effect his memory. Plans were made to meet with 3 sons, husband, and sister tomorrow [id number].   All questions/concerns addressed. Emotional support provided.   Primary Decision Maker Husband with support of sons    SUMMARY OF RECOMMENDATIONS   - GKellerfamily meeting 5/10 157 Code Status/Advance Care Planning:  Full code   Symptom Management:   Per attending and PCCM  Palliative Prophylaxis:   Bowel Regimen, Frequent Pain Assessment, Oral Care and Turn Reposition  Psycho-social/Spiritual:   Desire for further Chaplaincy support:yes  Additional Recommendations: Grief/Bereavement Support  Prognosis:   Overall prognosis extremely poor with significant brain damage.   Discharge Planning: To be determined. Overall prognosis poor and high risk of hospital  death.       Primary Diagnoses: Present on Admission: **None**   I have reviewed the medical record, interviewed the patient and family, and examined the patient. The following aspects are pertinent.  Past Medical History:  Diagnosis Date  . COPD (chronic obstructive pulmonary disease) ([place])   . Diabetes mellitus without complication [facility])    Social History   Socioeconomic History  . Marital status: Married    Spouse name: Not on file  . Number of children: Not on file  . Years of education: Not on file  . Highest education level: Not on file  Occupational History  . Not on file  Tobacco Use  . Smoking status: Current Some  Day Smoker  . Smokeless tobacco: Never Used  Substance and Sexual Activity  . Alcohol use: No  . Drug use: No  . Sexual activity: Not on file  Other Topics Concern  . Not on file  Social History Narrative  . Not on file   Social Determinants of Health   Financial Resource Strain:   . Difficulty of Paying Living Expenses:   Food Insecurity:   . Worried About Charity fundraiser in the Last Year:   . Arboriculturist in the Last Year:   Transportation Needs:   . Film/video editor (Medical):   Marland Kitchen Lack of Transportation (Non-Medical):   Physical Activity:   . Days of Exercise per Week:   . Minutes of Exercise per Session:   Stress:   . Feeling of Stress :   Social Connections:   . Frequency of Communication with Friends and Family:   . Frequency of Social Gatherings with Friends and Family:   . Attends Religious Services:   . Active Member of Clubs or Organizations:   . Attends Archivist Meetings:   Marland Kitchen Marital Status:    History reviewed. No pertinent family history. Scheduled Meds: . atropine      . chlorhexidine gluconate (MEDLINE KIT)  15 mL Mouth Rinse BID  . Chlorhexidine Gluconate Cloth  6 each Topical Q0600  . dexamethasone (DECADRON) injection  4 mg Intravenous Q6H  . feeding supplement (VITAL HIGH PROTEIN)  1,000 mL Per Tube Q24H  . heparin injection (subcutaneous)  5,000 Units Subcutaneous Q8H  . levETIRAcetam  500 mg Per Tube BID  . mouth rinse  15 mL Mouth Rinse 10 times per day  . pantoprazole sodium  40 mg Per Tube Daily  . sodium chloride flush  10-40 mL Intracatheter Q12H  . sodium chloride flush  3 mL Intravenous Once   Continuous Infusions: . methocarbamol (ROBAXIN) IV     PRN Meds:.acetaminophen (TYLENOL) oral liquid 160 mg/5 mL, [DISCONTINUED] acetaminophen **OR** acetaminophen, bisacodyl, diphenhydrAMINE, docusate, fentaNYL (SUBLIMAZE) injection, fentaNYL (SUBLIMAZE) injection, HYDROmorphone (DILAUDID) injection, methocarbamol (ROBAXIN)  IV, naloxone, ondansetron **OR** ondansetron (ZOFRAN) IV, oxyCODONE, sodium chloride flush No Known Allergies Review of Systems  Unable to perform ROS: Acuity of condition    Physical Exam Vitals and nursing note reviewed.  Constitutional:      General: She is not in acute distress.    Appearance: She is ill-appearing.     Interventions: She is intubated.  Cardiovascular:     Rate and Rhythm: Bradycardia present.  Pulmonary:     Effort: No tachypnea, accessory muscle usage or respiratory distress. She is intubated.     Comments: Not breathing over vent; no sedation Abdominal:     General: Abdomen is flat.     Palpations: Abdomen  is soft.  Neurological:     Mental Status: She is unresponsive.     Comments: Decerebrate posturing to painful stimuli; pupils unresponsive to light R pupil larger than L; Right sided staples clean and dry with no drainage or redness noted     Vital Signs: BP (!) 156/85   Pulse (!) 47   Temp (!) 95.8 F (35.4 C) (Axillary)   Resp 18   Ht 5' 5"  (1.651 m)   Wt 117 kg   SpO2 99%   BMI 42.92 kg/m  Pain Scale: CPOT POSS *See Group Information*: 1-Acceptable,Awake and alert Pain Score: 0-No pain   SpO2: SpO2: 99 % O2 Device:SpO2: 99 % O2 Flow Rate: .O2 Flow Rate (L/min): 2 L/min  IO: Intake/output summary:   Intake/Output Summary (Last 24 hours) at 10/13/2019 1053 Last data filed at 10/13/2019 0500 Gross per 24 hour  Intake 420 ml  Output 1325 ml  Net -905 ml    LBM: Last BM Date: 10/02/19 Baseline Weight: Weight: 114.3 kg Most recent weight: Weight: 117 kg     Palliative Assessment/Data: 10%     Time In: 1100 Time Out: 1200 Time Total: 60 min Greater than 50%  of this time was spent counseling and coordinating care related to the above assessment and plan.  Signed by: Vinie Sill, NP Palliative Medicine Team Pager # [phone] (M-F 8a-5p) Team Phone # [phone] (Nights/Weekends)

## 2019-10-13 NOTE — Progress Notes (Signed)
NAME:  Eun Surratt, MRN:  [id number], DOB:  [birth year], LOS: 94 ADMISSION DATE:  09/20/2019, CONSULTATION DATE:  10/13/2019 REFERRING MD:  Kathyrn Sheriff MD, CHIEF COMPLAINT:  Meningioma   Brief History   Ms. Daielle Poole is a 64 yo F with a h/o gestational DM and allergic rhinitis who presented to [facility] ED with 1 week of headache, gait instability, and confusion. She had no prior history of consistent headaches. She began to have memory difficulties, including forgetting where she was driving, misplacing meaningful items, etc. This prompted her to seek care in the ED. On presentation, vitals were stable. She did not have changes in vision, dizziness, numbness, tingling, or aphasia. Head CT revealed 3 cm solitary, right  Pterional dural-based mass, likely meningioma, with significant associated vasogenic edema and resulting 14 mm of midline shift. She was admitted to the [facility] Neurosurgery service. Her surgery on 4/29 was delayed as she was not NPO prior. She continued to do well until 5/4, when she was noted to have episodes of bradycardia overnight. She was then found to be unresponsive at 7AM; Rapid response was called, pt was emergently transferred to ICU, intubated, and PCCM was consulted.   Past Medical History  Gestational DM  Allergic Rhinitis   Significant Hospital Events   4/27: Admitted  4/29: Neurosurgery delayed d/t not NPO 5/4: Acute decompensation and intubation  5/4: Decompressive R frontotemporoparietal craniectomy for resection of R frontal meningioma; placement of bone flap into subcutaneous abdominal pocket 5/8 poor neuro status.  Primary team talking to family regarding goals of care.  Consults:  Neurosurgery (primary) PCCM  Procedures:  5/4: ETT emergently  5/4: Decompressive R frontotemporoparietal craniectomy for resection of R frontal meningioma; placement of bone flap into subcutaneous abdominal pocket 5/4: CVC placement, RIJ  5/4: A line  Significant  Diagnostic Tests:  4/27 Head CT Noncon:  1. Right frontal lobe severe vasogenic edema and 10 mm of leftward midline shift, effacement of the basilar cisterns and impending subfalcine herniation of the cingulate gyrus. 2. Partially calcified mass abutting the right frontal lobe is likely a meningioma. While this may be a source of the right frontal edema, there also could be an intraparenchymal lesion not visible by CT. MRI of the head with and without contrast might be helpful for further evaluation. 3. No acute hemorrhage.  4/28: Brain MRI W WO Con:  3 cm solitary, right pterional dural based mass favoring meningioma. There is contiguous profound vasogenic edema with 14 mm of midline shift and right uncal herniation.  5/4 CT Head Noncon:  Stable size of meningioma more fully characterized on prior MRI. There is persistent marked edema and mass effect, with further progression since 09/18/2019. This includes subfalcine, uncal, and cerebellar tonsillar herniation with trapping of the herniated temporal horn. There may be mild trapping of the posterior left lateral ventricle.  Micro Data:  4/29: MRSA PCR negative  Antimicrobials:  5/4: Ancef 2g preop ppx   Interim history/subjective:  New hypothermia Objective   Blood pressure (Abnormal) 156/85, pulse (Abnormal) 47, temperature (Abnormal) 95.8 F (35.4 C), temperature source Axillary, resp. rate 18, height 5\' 5"  (1.651 m), weight 117 kg, SpO2 99 %.    Vent Mode: PRVC FiO2 (%):  [40 %] 40 % Set Rate:  [18 bmp] 18 bmp Vt Set:  [450 mL] 450 mL PEEP:  [5 cmH20] 5 cmH20 Plateau Pressure:  [14 cmH20-15 cmH20] 15 cmH20   Intake/Output Summary (Last 24 hours) at 10/13/2019 1002 Last data  filed at 10/13/2019 0500 Gross per 24 hour  Intake 420 ml  Output 1325 ml  Net -905 ml   Filed Weights   09/16/2019 2117 09/28/2019 0122  Weight: 114.3 kg 117 kg  General 64 year old black female currently comatose with intermittent  myoclonus HEENT normocephalic atraumatic surgical site unremarkable Pulmonary: Clear to auscultation remains on full ventilatory support Cardiac: Regular rate and rhythm Extremities: Warm dry Abdomen soft nontender Neuro: Unresponsive, continues with intermittent myoclonus.  Otherwise no gag or cough.   Resolved Hospital Problem list   none  Assessment & Plan:  Ms. Hedi Vandeman is a 64 year old female who presented for progressive headache and confusion found to have 3 cm solitary, right pterional dural based mass c/f meningioma.   Right meningioma with cerebral edema, and brain herniation status post resection -Remains comatose with minimal to no improvement -Hypertonic saline discontinued Plan Continue supportive care  Continue Decadron and Keppra  We will ask palliative care to see her to assist with goals of care discussion along with neurosurgery   Hypothermia Plan Continue Bair hugger  Ventilator dependence in the setting of inability to protect airway from devastating brain injury Plan Continue full ventilatory support she is not a candidate for extubation VAP bundle  Fluid electrolyte imbalance: Hypernatremia, hyperchloremia Plan Limiting lab work   [employer] practice:  Diet: TF trickle Pain/Anxiety/Delirium protocol (if indicated): Fentanyl/Dilaudid/Propofol ; none running  VAP protocol (if indicated): Per protocol  DVT prophylaxis: heparin  GI prophylaxis: Protonix Glucose control: SSI Mobility: Intubated, bedrest  Code Status: FULL  Family Communication: Per NSGY, updated 5/4 AM post-operatively Disposition: ICU she is full code currently.  Would limit interventions given poor prognosis.  Goals of care discussion ongoing  Critical care time 31 minutes Erick Colace ACNP-BC [place] Pager # [phone] OR # [phone] if no answer

## 2019-10-13 NOTE — Progress Notes (Signed)
Subjective: NAEs o/n  Objective: Vital signs in last 24 hours: Temp:  [94.3 F (34.6 C)-96.6 F (35.9 C)] 95.8 F (35.4 C) (05/09 0700) Pulse Rate:  [43-67] 47 (05/09 0700) Resp:  [16-18] 18 (05/09 0700) BP: (129-165)/(69-85) 156/85 (05/09 0700) SpO2:  [97 %-100 %] 99 % (05/09 0700) FiO2 (%):  [40 %] 40 % (05/09 0335)  Intake/Output from previous day: 05/08 0701 - 05/09 0700 In: 460 [NG/GT:460] Out: 1325 [Urine:1325] Intake/Output this shift: No intake/output data recorded. Eyes closed, right pupil 6 mm NR, left pupil 4 mm NR, no corneal reflex Extensor posturing to stimulation Intubated. Incision clean, flap flat.  Lab Results: Recent Labs    10/12/19 0433  WBC 13.9*  HGB 12.3  HCT 40.0  PLT 194   BMET Recent Labs    10/12/19 0433 10/13/19 0609  NA 156* 155*  K 3.7 4.0  CL 120* 119*  CO2 26 27  GLUCOSE 136* 158*  BUN 36* 36*  CREATININE 0.87 0.85  CALCIUM 8.5* 8.9    Studies/Results: DG Abd 1 View  Result Date: 10/12/2019 CLINICAL DATA:  Orogastric tube placement EXAM: ABDOMEN - 1 VIEW COMPARISON:  Oct 10, 2019 FINDINGS: Orogastric tube tip and side port are in the stomach. There is no appreciable bowel dilatation or air-fluid level in the visualized bowel region. No free air. Lung bases clear. IMPRESSION: Orogastric tube tip and side port in stomach. Visualized bowel unremarkable. No free air. Electronically Signed   By: Lowella Grip III M.D.   On: 10/12/2019 12:13    Assessment/Plan: 64 yo F who suffered herniation from extensive brain edema s/p hemicraniectomy - prognosis for meaningful functional recovery remains dismal. - family deciding on goals of care - cont Keppra, dexamethasone, hep subQ - supportive care per ICU team-- assistance appreciated  Vallarie Mare 10/13/2019, 10:21 AM

## 2019-10-13 NOTE — Progress Notes (Signed)
Palliative:  Family meeting with 3 sons Legrand Como, Dorothea Ogle, Juanna Cao), sister Fraser Din, and husband Jeanell Sparrow (he has his own health issues and sometimes forgetful per family) with Tacey Ruiz, NP tomorrow 5/10 1800 to further discuss goals of care and how to move forward. I did spend time explaining to Nason today via telephone the signs that we are seeing that are indicating very poor prognosis and why these conversations and decisions are coming up to family. He was appreciative of the conversation and hopeful to get all family caught up and on the same page at meeting tomorrow.   Thank you for this consult.   Vinie Sill, NP Palliative Medicine Team Pager [phone] (Please see [email or web address] for schedule) Team Phone [phone]

## 2019-10-14 LAB — GLUCOSE, CAPILLARY
Glucose-Capillary: 123 mg/dL — ABNORMAL HIGH (ref 70–99)
Glucose-Capillary: 131 mg/dL — ABNORMAL HIGH (ref 70–99)
Glucose-Capillary: 131 mg/dL — ABNORMAL HIGH (ref 70–99)
Glucose-Capillary: 132 mg/dL — ABNORMAL HIGH (ref 70–99)
Glucose-Capillary: 133 mg/dL — ABNORMAL HIGH (ref 70–99)
Glucose-Capillary: 145 mg/dL — ABNORMAL HIGH (ref 70–99)
Glucose-Capillary: 146 mg/dL — ABNORMAL HIGH (ref 70–99)
Glucose-Capillary: 153 mg/dL — ABNORMAL HIGH (ref 70–99)

## 2019-10-14 MED ORDER — LABETALOL HCL 5 MG/ML IV SOLN: 10.0000 mg | INTRAVENOUS | Status: DC | PRN

## 2019-10-14 NOTE — Progress Notes (Signed)
[place] Progress Note Patient Name: Kimberly Daniel DOB: May 23, 1956 MRN: [id number]   Date of Service  10/14/2019  HPI/Events of Note  Notified of hypertension. On chart review evidence of cerebral herniation which is likely the etiology of her hemodynamic changes  eICU Interventions  Ordered labetalol 10 mg IV prn for SBP > 180     Intervention Category Major Interventions: Hypertension - evaluation and management  Kimberly Daniel Kimberly Daniel 10/14/2019, 2:53 AM

## 2019-10-14 NOTE — Progress Notes (Signed)
NAME:  Kimberly Daniel, MRN:  [id number], DOB:  01-04-1956, LOS: 12 ADMISSION DATE:  09/14/2019, CONSULTATION DATE:  10/11/2019 REFERRING MD:  Kathyrn Sheriff MD, CHIEF COMPLAINT:  Meningioma   Brief History   Ms. Kimberly Daniel is a 64 yo F with a h/o gestational DM and allergic rhinitis who presented to [facility] ED with 1 week of headache, gait instability, and confusion. She had no prior history of consistent headaches. She began to have memory difficulties, including forgetting where she was driving, misplacing meaningful items, etc. This prompted her to seek care in the ED. On presentation, vitals were stable. She did not have changes in vision, dizziness, numbness, tingling, or aphasia. Head CT revealed 3 cm solitary, right  Pterional dural-based mass, likely meningioma, with significant associated vasogenic edema and resulting 14 mm of midline shift. She was admitted to the [facility] Neurosurgery service. Her surgery on 4/29 was delayed as she was not NPO prior. She continued to do well until 5/4, when she was noted to have episodes of bradycardia overnight. She was then found to be unresponsive at 7AM; Rapid response was called, pt was emergently transferred to ICU, intubated, and PCCM was consulted.   Past Medical History  Gestational DM  Allergic Rhinitis   Significant Hospital Events   4/27: Admitted  4/29: Neurosurgery delayed d/t not NPO 5/4: Acute decompensation and intubation  5/4: Decompressive R frontotemporoparietal craniectomy for resection of R frontal meningioma; placement of bone flap into subcutaneous abdominal pocket 5/8 poor neuro status.  Primary team talking to family regarding goals of care.  Consults:  Neurosurgery (primary) PCCM  Procedures:  5/4: ETT emergently  5/4: Decompressive R frontotemporoparietal craniectomy for resection of R frontal meningioma; placement of bone flap into subcutaneous abdominal pocket 5/4: CVC placement, RIJ  5/4: A line  Significant  Diagnostic Tests:  4/27 Head CT Noncon:  1. Right frontal lobe severe vasogenic edema and 10 mm of leftward midline shift, effacement of the basilar cisterns and impending subfalcine herniation of the cingulate gyrus. 2. Partially calcified mass abutting the right frontal lobe is likely a meningioma. While this may be a source of the right frontal edema, there also could be an intraparenchymal lesion not visible by CT. MRI of the head with and without contrast might be helpful for further evaluation. 3. No acute hemorrhage.  4/28: Brain MRI W WO Con:  3 cm solitary, right pterional dural based mass favoring meningioma. There is contiguous profound vasogenic edema with 14 mm of midline shift and right uncal herniation.  5/4 CT Head Noncon:  Stable size of meningioma more fully characterized on prior MRI. There is persistent marked edema and mass effect, with further progression since 09/09/2019. This includes subfalcine, uncal, and cerebellar tonsillar herniation with trapping of the herniated temporal horn. There may be mild trapping of the posterior left lateral ventricle.  Micro Data:  4/29: MRSA PCR negative  Antimicrobials:  5/4: Ancef 2g preop ppx   Interim history/subjective:   Slight hypothermic 96 on Saturday, euthermic last 24 hours Hypertensive Urine output adequate  Objective   Blood pressure (!) 152/86, pulse 72, temperature 98.4 F (36.9 C), temperature source Axillary, resp. rate (!) 23, height 5\' 5"  (1.651 m), weight 115.4 kg, SpO2 99 %.    Vent Mode: PRVC FiO2 (%):  [40 %] 40 % Set Rate:  [18 bmp] 18 bmp Vt Set:  [450 mL] 450 mL PEEP:  [5 cmH20] 5 cmH20 Plateau Pressure:  [14 cmH20-16 cmH20] 16 cmH20  Intake/Output Summary (Last 24 hours) at 10/14/2019 0934 Last data filed at 10/14/2019 0800 Gross per 24 hour  Intake 540 ml  Output 1525 ml  Net -985 ml   Filed Weights   09/14/2019 2117 09/12/2019 0122 10/14/19 0448  Weight: 114.3 kg 117 kg 115.4 kg      General elderly woman, orally intubated, unresponsive HEENT normocephalic atraumatic surgical site unremarkable Pulmonary: Clear to auscultation remains on full ventilatory support Cardiac: Regular rate and rhythm Extremities: Warm dry Abdomen soft nontender Neuro: Comatose, GCS 3, right pupil 4 mm not reactive, left 2 mm not reactive, doll's eye absent, triggers went when RR turned down to [facility] Problem list   none  Assessment & Plan:  Ms. Kimberly Daniel is a 64 year old female who presented for progressive headache and confusion found to have 3 cm solitary, right pterional dural based mass c/f meningioma.   Right meningioma with cerebral edema, and brain herniation status post resection -Remains comatose with minimal to no improvement -Hypertonic saline discontinued Plan Continue supportive care  Continue Decadron and Keppra    Acute respiratory failure in the setting of inability to protect airway from devastating brain injury Plan Continue full ventilatory support she is not a candidate for extubation VAP bundle   Hypernatremia, hyperchloremia -induced by 3% saline Plan Follow  Goals of care -being formulated by neurosurgery and palliative care involved now.  Continue supportive care meantime.  Anticipate withdrawal of life support once family has come to terms with her dismal neuro prognosis   Best practice:  Diet: TF trickle Pain/Anxiety/Delirium protocol (if indicated): Fentanyl as needed VAP protocol (if indicated): Per protocol  DVT prophylaxis: heparin  GI prophylaxis: Protonix Glucose control: SSI Mobility: Intubated, bedrest  Code Status: FULL  Family Communication: Per NSGY Disposition: ICU   The patient is critically ill with multiple organ systems failure and requires high complexity decision making for assessment and support, frequent evaluation and titration of therapies, application of advanced monitoring technologies and extensive  interpretation of multiple databases. Critical Care Time devoted to patient care services described in this note independent of APP/resident  time is 31 minutes.   Kara Mead MD. [place] Flood. [place] [facility]  If no response to pager , please call [phone]   10/14/2019

## 2019-10-15 LAB — GLUCOSE, CAPILLARY
Glucose-Capillary: 144 mg/dL — ABNORMAL HIGH (ref 70–99)
Glucose-Capillary: 169 mg/dL — ABNORMAL HIGH (ref 70–99)
Glucose-Capillary: 145 mg/dL — ABNORMAL HIGH (ref 70–99)
Glucose-Capillary: 175 mg/dL — ABNORMAL HIGH (ref 70–99)
Glucose-Capillary: 158 mg/dL — ABNORMAL HIGH (ref 70–99)
Glucose-Capillary: 166 mg/dL — ABNORMAL HIGH (ref 70–99)

## 2019-10-15 LAB — BASIC METABOLIC PANEL
Anion gap: 9 (ref 5–15)
BUN: 30 mg/dL — ABNORMAL HIGH (ref 8–23)
CO2: 29 mmol/L (ref 22–32)
Calcium: 8.9 mg/dL (ref 8.9–10.3)
Chloride: 111 mmol/L (ref 98–111)
Creatinine, Ser: 0.68 mg/dL (ref 0.44–1.00)
GFR calc Af Amer: >60 mL/min (ref >60)
GFR calc non Af Amer: >60 mL/min (ref >60)
Glucose, Bld: 165 mg/dL — ABNORMAL HIGH (ref 70–99)
Potassium: 4.2 mmol/L (ref 3.5–5.1)
Sodium: 149 mmol/L — ABNORMAL HIGH (ref 135–145)

## 2019-10-15 NOTE — Progress Notes (Signed)
Palliative Care Progress Note  Family called our team to reschedule palliative care/goals of care meeting for today. Patient was seen and evaluated and care discussed with RN.  Patient remains intubated, no change in her condition with extremely poor prognosis and no chance for meaningful recovery.Episodes of bradycardia with full code status are concerning- strongly recommend DNR order in case of sudden cardiac death, will explain to family chest compressions and further escalation of medical interventions would not provide any benefit to Kimberly Daniel in her current condition.   Lane Hacker, DO Palliative Medicine

## 2019-10-15 NOTE — Progress Notes (Signed)
NAME:  Kimberly Daniel, MRN:  [id number], DOB:  06-Nov-1955, LOS: 37 ADMISSION DATE:  09/16/2019, CONSULTATION DATE:  10/10/2019 REFERRING MD:  Kathyrn Sheriff MD, CHIEF COMPLAINT:  Meningioma   Brief History   Kimberly Daniel is a 64 yo F with a h/o gestational DM and allergic rhinitis who presented to [facility] ED with 1 week of headache, gait instability, and confusion. She had no prior history of consistent headaches. She began to have memory difficulties, including forgetting where she was driving, misplacing meaningful items, etc. This prompted her to seek care in the ED. On presentation, vitals were stable. She did not have changes in vision, dizziness, numbness, tingling, or aphasia. Head CT revealed 3 cm solitary, right  Pterional dural-based mass, likely meningioma, with significant associated vasogenic edema and resulting 14 mm of midline shift. She was admitted to the [facility] Neurosurgery service. Her surgery on 4/29 was delayed as she was not NPO prior. She continued to do well until 5/4, when she was noted to have episodes of bradycardia overnight. She was then found to be unresponsive at 7AM; Rapid response was called, pt was emergently transferred to ICU, intubated, and PCCM was consulted.   Past Medical History  Gestational DM  Allergic Rhinitis   Significant Hospital Events   4/27: Admitted  4/29: Neurosurgery delayed d/t not NPO 5/4: Acute decompensation and intubation  5/4: Decompressive R frontotemporoparietal craniectomy for resection of R frontal meningioma; placement of bone flap into subcutaneous abdominal pocket 5/8 poor neuro status.  Primary team talking to family regarding goals of care.  Consults:  Neurosurgery (primary) PCCM  Procedures:  5/4: ETT emergently  5/4: Decompressive R frontotemporoparietal craniectomy for resection of R frontal meningioma; placement of bone flap into subcutaneous abdominal pocket 5/4: CVC placement, RIJ  5/4: A line  Significant  Diagnostic Tests:  4/27 Head CT Noncon:  1. Right frontal lobe severe vasogenic edema and 10 mm of leftward midline shift, effacement of the basilar cisterns and impending subfalcine herniation of the cingulate gyrus. 2. Partially calcified mass abutting the right frontal lobe is likely a meningioma. While this may be a source of the right frontal edema, there also could be an intraparenchymal lesion not visible by CT. MRI of the head with and without contrast might be helpful for further evaluation. 3. No acute hemorrhage.  4/28: Brain MRI W WO Con:  3 cm solitary, right pterional dural based mass favoring meningioma. There is contiguous profound vasogenic edema with 14 mm of midline shift and right uncal herniation.  5/4 CT Head Noncon:  Stable size of meningioma more fully characterized on prior MRI. There is persistent marked edema and mass effect, with further progression since 09/25/2019. This includes subfalcine, uncal, and cerebellar tonsillar herniation with trapping of the herniated temporal horn. There may be mild trapping of the posterior left lateral ventricle.  Micro Data:  4/29: MRSA PCR negative  Antimicrobials:  5/4: Ancef 2g preop ppx   Interim history/subjective:  5/11: no events overnight. Family meeting set for today.  5/10: Slight hypothermic 96 on Saturday, euthermic last 24 hours Hypertensive Urine output adequate  Objective   Blood pressure (!) 157/93, pulse 64, temperature 98.4 F (36.9 C), resp. rate 18, height 5\' 5"  (1.651 m), weight 115.4 kg, SpO2 100 %.    Vent Mode: PRVC FiO2 (%):  [40 %] 40 % Set Rate:  [18 bmp] 18 bmp Vt Set:  [450 mL] 450 mL PEEP:  [5 cmH20] 5 cmH20 Plateau Pressure:  [  13 cmH20-20 cmH20] 14 cmH20   Intake/Output Summary (Last 24 hours) at 10/15/2019 0947 Last data filed at 10/15/2019 0600 Gross per 24 hour  Intake 450 ml  Output 1325 ml  Net -875 ml   Filed Weights   09/19/2019 2117 09/21/2019 0122 10/14/19 0448    Weight: 114.3 kg 117 kg 115.4 kg    General elderly woman, orally intubated, unresponsive HEENT normocephalic atraumatic surgical site unremarkable, sclera anicteric Pulmonary: rhonchi bilaterally today on full ventilatory support Cardiac: Regular rate and rhythm Extremities: Warm dry Abdomen soft nontender, bs decreased Neuro: Comatose, GCS 3, right pupil 4 mm not reactive, left 2 mm not reactive, doll's eye absent, triggers went when RR turned down to 10, + babinski and decerebrate posturing with painful stim  Resolved Hospital Problem list   none  Assessment & Plan:  Kimberly Daniel is a 64 year old female who presented for progressive headache and confusion found to have 3 cm solitary, right pterional dural based mass c/f meningioma.   Right meningioma with cerebral edema, and brain herniation status post resection -Remains comatose with no improvement -Hypertonic saline discontinued Plan Continue supportive care  Continue Decadron and Keppra  -consider stopping keppra as it has been 7 days.    Acute respiratory failure in the setting of inability to protect airway from devastating brain injury Plan Continue full ventilatory support she is not a candidate for extubation VAP bundle   Hypernatremia, hyperchloremia -induced by 3% saline Plan Follow Slowly drifting down  Constipation:  -will provide bowel regimen pending family conversation.   Goals of care -being formulated by neurosurgery and palliative care involved now.  Continue supportive care meantime.  Anticipate withdrawal of life support once family has come to terms with her dismal neuro prognosis   Best practice:  Diet: TF trickle. Could escalate rate pending family conversation Pain/Anxiety/Delirium protocol (if indicated): Fentanyl as needed VAP protocol (if indicated): Per protocol  DVT prophylaxis: heparin  GI prophylaxis: Protonix Glucose control: SSI Mobility: Intubated, bedrest  Code Status:  FULL  Family Communication: Per NSGY Disposition: ICU   Critical care time: The patient is critically ill with multiple organ systems failure and requires high complexity decision making for assessment and support, frequent evaluation and titration of therapies, application of advanced monitoring technologies and extensive interpretation of multiple databases.  Critical care time 35 mins. This represents my time independent of the NPs time taking care of the pt. This is excluding procedures.    [facility] 10/15/2019, 9:47 AM

## 2019-10-15 NOTE — Progress Notes (Signed)
Palliative:  HPI: 64 y.o. female  with past medical history of COPD, diabetes admitted on 09/06/2019 with dizziness, right sided headaches, gait instability, memory changes/confusion and found to have right frontal brain mass (likely meningioma) with 91m midline shift and significant vasogenic edema. She was placed on steroids and had emergent surgery 5/4 after an acute decline requiring intubation. She has not had any further signs of improvement and neurological assessment is extremely poor. Prognosis is grim.    I met today with Kimberly Daniel's sons Kimberly Daniel Their brother Kimberly Daniel unable to get of work to join [place] Kimber[place] Daniel's husband is not having a good day with his health. We had a conversation regarding Kimberly Daniel's condition and concern that her neurological exam is very concerning for severe brain damage. I explained to them some of the signs of examination that makes [place] that her prognosis is extremely poor and that she is not expected to improve.   Kimberly Daniel Kimberly [place] understandably and appropriately upset by this discussion. They were open and honest about their concerns that these decisions are rushed and they want to be sure that there are offered all options. They request second opinion. They express concerns with communication during hospital stay that led to postponement of surgery. They also express concerns with trust in our hospital which I appreciate their honesty and hopefully we can work with family to support and earn back some trust.   We discussed that she continues to be unstable at times and concern with bradycardia. I expressed my concern that she could decline and her heart could stop at any time. I encouraged them to talk and consider if resuscitation and CPR would be beneficial to her at this stage. I expressed my concern in doing interventions that cause her pain and suffering as these interventions will not give her improvement or reverse her brain damage. They express that  they will need to discuss these decisions more as a family and unable to make decisions today.   All questions/concerns addressed to the best of my ability. Emotional support provided. Discussed with neurosurgery PA regarding conversation and request for second opinion. Request to expand visitation for all 3 sons was denied although I would support sons to be at bedside as much as possible to be able to speak with providers as well as follow their mother's progress and assessments.   Exam: Unresponsive. Decerebrate posturing. Pupils not equal size and not reactive to light. No gag reflex. Not breathing over ventilator. No distress. Extremities warm to touch.   Plan: - Family requesting second opinion. [facility]. They need to feel peace that they have exhausted all efforts and options that could provide any level of improvement.  - Family continue to discuss how to move forward. Very concerned with making decisions too soon.  - I will continue to follow and help to support family through difficult situation.   [place] NP Palliative Medicine Team Pager [phone](Please see [email or web address] for schedule) Team Phone [phone]   Greater than 50%  of this time was spent counseling and coordinating care related to the above assessment and plan

## 2019-10-15 NOTE — Progress Notes (Signed)
  NEUROSURGERY PROGRESS NOTE   No issues overnight.   EXAM:  BP (!) 151/82   Pulse 68   Temp 98.1 F (36.7 C)   Resp 18   Ht 5\' 5"  (1.651 m)   Wt 115.4 kg   SpO2 99%   BMI 42.34 kg/m   Intubated Initiates some breaths Right pupil 58mm non-reactive, left 33mm non-reactive (-) cough/gag No motor responses to central pain  IMPRESSION:  64 y.o. female with severe brainstem dysfunction/comatose related to severe edema from underlying WHO grade 1 meningioma. Without change in exam over last 6 days, the chances for meaningful recovery remain negligible.  PLAN: - Plan on family meeting with Palliative care this pm

## 2019-10-15 NOTE — Progress Notes (Signed)
  NEUROSURGERY PROGRESS NOTE   No issues overnight.   EXAM:  BP (!) 151/82   Pulse 68   Temp 98.1 F (36.7 C)   Resp 18   Ht 5\' 5"  (1.651 m)   Wt 115.4 kg   SpO2 99%   BMI 42.34 kg/m   Intubated Initiates some breaths Right pupil 42mm non-reactive, left 56mm non-reactive (-) cough/gag No motor responses to central pain  IMPRESSION:  64 y.o. female  Comatose without hope for meaningful recovery. Meeting with palliative care team postponed to this afternoon.  PLAN: - Family to decide on goals of care and code status given the lack of any hope for meaningful recovery.

## 2019-10-16 ENCOUNTER — Inpatient Hospital Stay (HOSPITAL_COMMUNITY): Payer: Self-pay

## 2019-10-16 DIAGNOSIS — L8990 Pressure ulcer of unspecified site, unspecified stage: Secondary | ICD-10-CM | POA: Insufficient documentation

## 2019-10-16 LAB — GLUCOSE, CAPILLARY
Glucose-Capillary: 153 mg/dL — ABNORMAL HIGH (ref 70–99)
Glucose-Capillary: 187 mg/dL — ABNORMAL HIGH (ref 70–99)
Glucose-Capillary: 187 mg/dL — ABNORMAL HIGH (ref 70–99)
Glucose-Capillary: 189 mg/dL — ABNORMAL HIGH (ref 70–99)
Glucose-Capillary: 207 mg/dL — ABNORMAL HIGH (ref 70–99)
Glucose-Capillary: 229 mg/dL — ABNORMAL HIGH (ref 70–99)

## 2019-10-16 MED ORDER — PRO-STAT SUGAR FREE PO LIQD
30.0000 mL | Freq: Two times a day (BID) | ORAL | Status: DC
  Administered 2019-10-16 – 2019-10-17 (×3): 30 mL
  Filled 2019-10-16 (×3): qty 30

## 2019-10-16 MED ORDER — POLYETHYLENE GLYCOL 3350 17 G PO PACK
17.0000 g | PACK | Freq: Every day | ORAL | Status: DC
  Administered 2019-10-16: 17 g
  Filled 2019-10-16: qty 1

## 2019-10-16 MED ORDER — VITAL HIGH PROTEIN PO LIQD
1000.0000 mL | ORAL | Status: DC
  Administered 2019-10-17: 1000 mL

## 2019-10-16 MED ORDER — DOCUSATE SODIUM 50 MG/5ML PO LIQD
100.0000 mg | Freq: Two times a day (BID) | ORAL | Status: DC
  Administered 2019-10-16 – 2019-10-21 (×11): 100 mg
  Filled 2019-10-16 (×12): qty 10

## 2019-10-16 NOTE — Progress Notes (Signed)
Spoke with Sarah RN she states she is aware of the DC central line order

## 2019-10-16 NOTE — Progress Notes (Addendum)
  NEUROSURGERY PROGRESS NOTE   No issues overnight.  EXAM:  BP (!) 150/91   Pulse 65   Temp 98.1 F (36.7 C)   Resp 18   Ht 5\' 5"  (1.651 m)   Wt 115.4 kg   SpO2 100%   BMI 42.34 kg/m   Intubated Initiates some breaths Right pupil 45mm non-reactive, left 49mm non-reactive (-) cough/gag Extensor posturing BLE to pain occasionally   IMPRESSION/PLAN 64 y.o. female Comatose without hope for meaningful recovery. Family still discussing [place]. Will touch base again today.  Dr Kathyrn Sheriff who has discussed the case with Dr Leonel Ramsay, Neurology, who has agreed to see patient for a second opinion regarding prognosis.

## 2019-10-16 NOTE — Progress Notes (Signed)
Nutrition Follow-up  DOCUMENTATION CODES:   Morbid obesity  INTERVENTION:   Advance TF to goal via OG tube:  Vital High Protein @ 50 ml/hr  30 ml Prostat BID  Provides: 1400 kcal, 135 grams protein, and 1003 ml free water.    NUTRITION DIAGNOSIS:   Inadequate oral intake related to inability to eat as evidenced by NPO status.  GOAL:   Provide needs based on ASPEN/SCCM guidelines  MONITOR:   TF tolerance, Labs  REASON FOR ASSESSMENT:   Consult, Ventilator Enteral/tube feeding initiation and management  ASSESSMENT:   Pt with PMH of gestational DM and allergic rhinitis who was admitted 4/28 with 1 week of headache, gait instability, and confusion. Per CT pt with 3 cm meningioma with significant associated vasogenic edema with 14 mm midline shift.   Pt discussed during ICU rounds and with RN.  Per neurosurgery pt with poor prognosis, family requesting second opinion.   4/29 surgery was delayed as pt was not NPO 5/4 pt with bradycardia and unresponsiveness; tx to ICU and intubated; pt went for emergent decompressive craniectomy/resection of tumor. Pt now with minimal brainstem function. Prognosis poor.  5/6 TF initiated    Patient is currently intubated on ventilator support MV: 8.2 L/min Temp (24hrs), Avg:98.8 F (37.1 C), Min:97.7 F (36.5 C), Max:99.9 F (37.7 C)  Medications reviewed and include: decadron, miralax  Labs reviewed: Na 149 (H) CBG's: [phone]    Diet Order:   Diet Order            Diet NPO time specified  Diet effective now              EDUCATION NEEDS:   No education needs have been identified at this time  Skin:  Skin Assessment: Skin Integrity Issues: Skin Integrity Issues:: Stage II Stage II: sacrum  Last BM:  4/28  Height:   Ht Readings from Last 1 Encounters:  09/13/2019 5\' 5"  (1.651 m)    Weight:   Wt Readings from Last 1 Encounters:  10/14/19 115.4 kg    Ideal Body Weight:  56.8 kg  BMI:  Body mass index  is 42.34 kg/m.  Estimated Nutritional Needs:   Kcal:  1300-1600  Protein:  114-142 grams  Fluid:  > 1.5 L/day  Lockie Pares., RD, LDN, CNSC See AMiON for contact information

## 2019-10-16 NOTE — Progress Notes (Signed)
Palliative:  HPI: 64 y.o.femalewith past medical history of COPD, diabetesadmitted on 4/27/2021with dizziness, right sided headaches, gait instability, memory changes/confusion and found to have right frontal brain mass (likely meningioma) with 34mm midline shift and significant vasogenic edema.She was placed on steroids and had emergent surgery 5/4 after an acute decline requiring intubation. She has not had any further signs of improvement and neurological assessment is extremely poor. Prognosis is grim.   I visited again at [facility] bedside. Her assessment has not changed and she continues with grim prognosis with severe irreversible brain damage as evident by exam with pupils unequal and NON-REACTIVE to light, no gag reflex, decerebrate posturing with stimuli. In fact the only part of my assessment that reflects that she is not completely brain dead is that she is breathing over ventilator. I am concerned about her level of suffering as she is unable to express to [place] if she is in pain as most patient's have much discomfort from breathing tubes. I worry that this is not a quality of life that is acceptable to most people.    I reached out to son, Randall Hiss, as he has been my main point of contact. He has had a conversation with neurology Dr. Leonel Ramsay who has further assessed and discussed recommendations with Randall Hiss. Family struggling to find a time to get together to meet with [place] as 1 son has difficulty getting away from work. I have offered a note or any assistance needed to help him get some approved time to come discuss care with family and medical team. Randall Hiss will try and find a time tomorrow to get all 3 sons, husband, and sister to the hospital to meet. I requested that he let me know a time as soon as possible so that we can attempt to get input from as many of the providers helping to care for his mother during this meeting so family can have all their questions and concerns addressed.   All  questions/concerns addressed to the best of my ability. Emotional support provided to this family that are faced with an impossible decision.   Exam: Unresponsive. Decerebrate posturing. Pupils not equal size and not reactive to light. No gag reflex. Not breathing over ventilator. No distress. Extremities warm to touch.    Plan: - Dr. Leonel Ramsay has offered second opinion to family as requested. Would be helpful to pull in neurology and neurosurgery (as they are able and available) to meet with family along with palliative care hopefully tomorrow.  - Very difficult situation with no good options. Family struggling with lack of options given the abruptness of this decline which was unexpected.   25 min  Vinie Sill, NP Palliative Medicine Team Pager [phone] (Please see [email or web address] for schedule) Team Phone [phone]    Greater than 50%  of this time was spent counseling and coordinating care related to the above assessment and plan

## 2019-10-16 NOTE — Consult Note (Signed)
Neurology Consultation Reason for Consult: Prognosis Referring Physician: Fanny Skates  CC: Unresponsiveness  History is obtained from: Chart review  HPI: Kimberly Daniel is a 64 y.o. female who presented with headache, worsening gait was found to have a large meningioma.  She was set up for surgical resection, but unfortunately the morning of surgery she had consumed breakfast that was brought in by family member.  She was scheduled for surgery on the morning of 5/4, but unfortunately overnight from 5/3-5/4 she became unresponsive.  CT head revealed worsening herniation and she was taken emergently for surgical decompression after receiving hyperosmolar therapy.  Unfortunately, in the intervening week she has had minimal improvement.  Palliative care has been involved, and neurosurgery has informed the family that she does not have any significant chance of recovery.  They asked for a second opinion and therefore I have been asked to evaluate the patient.   ROS: Unable to obtain due to altered mental status.   Past Medical History:  Diagnosis Date  . COPD (chronic obstructive pulmonary disease) ([place])   . Diabetes mellitus without complication ([place])      History reviewed. No pertinent family history.   Social History:  reports that she has been smoking. She has never used smokeless tobacco. She reports that she does not drink alcohol or use drugs.   Exam: Current vital signs: BP (!) 148/85   Pulse 65   Temp 97.7 F (36.5 C)   Resp 18   Ht 5\' 5"  (1.651 m)   Wt 115.4 kg   SpO2 100%   BMI 42.34 kg/m  Vital signs in last 24 hours: Temp:  [97.7 F (36.5 C)-99.9 F (37.7 C)] 97.7 F (36.5 C) (05/12 1300) Pulse Rate:  [63-88] 65 (05/12 1400) Resp:  [18-24] 18 (05/12 1400) BP: (137-169)/(85-99) 148/85 (05/12 1400) SpO2:  [98 %-100 %] 100 % (05/12 1400) FiO2 (%):  [18 %-40 %] 40 % (05/12 1147)   Physical Exam  Constitutional: Appears well-developed and  well-nourished.  Psych: Unresponsive Eyes: No scleral injection HENT: No OP obstrucion MSK: no joint deformities.  Cardiovascular: Normal rate and regular rhythm.  Respiratory: Effort normal, non-labored breathing GI: Soft.  No distension. There is no tenderness.  Skin: WDI  Neuro: Mental Status: Patient is comatose, she does not open eyes or follow commands. Cranial Nerves: II: She does not blink to threat, right pupil is 6 mm and unreactive, left pupil is 4 mm and unreactive III,IV, VI: No response to doll's eye V:VII: Corneals are absent bilaterally X: Cough is absent Motor: Extensor posturing to noxious stimulation in the right arm and bilateral legs, no response in the left arm  sensory: As above  Cerebellar: Does not perform   I have reviewed labs in epic and the results pertinent to this consultation are: Sodium 149  I have reviewed the images obtained: CT head-extensive injury including herniation with hypodensity in the brainstem.  Impression: 64 year old female with herniation secondary to edema/increased ICP status post emergent craniectomy.  Unfortunately, she has sustained severe injury and at this point does not have any significant chance of recovery given her exam and findings on imaging.  I discussed this with her son Kimberly Daniel by phone.  Recommendations: 1) I initially recommended CT head which has been completed at the time of finalizing this note.   2) Continue discussions with palliative care 3) I will be available on an as-needed basis moving forward.   Roland Rack, MD [facility] [phone]  If  7pm- 7am, please page neurology on call as listed in [place].

## 2019-10-16 NOTE — Progress Notes (Signed)
Patient transported from the ICU to CT and back on current vent settings with FiO2 100%.  Patient tolerated transport well; no complications noted.

## 2019-10-16 NOTE — Progress Notes (Signed)
NAME:  Kimberly Daniel, MRN:  [id number], DOB:  1955-11-08, LOS: 47 ADMISSION DATE:  09/09/2019, CONSULTATION DATE:  10/16/2019 REFERRING MD:  Kathyrn Sheriff MD, CHIEF COMPLAINT:  Meningioma   Brief History   Ms. Paraskevi Hoston is a 64 yo F with a h/o gestational DM and allergic rhinitis who presented to [facility] ED with 1 week of headache, gait instability, and confusion. She had no prior history of consistent headaches. She began to have memory difficulties, including forgetting where she was driving, misplacing meaningful items, etc. This prompted her to seek care in the ED. On presentation, vitals were stable. She did not have changes in vision, dizziness, numbness, tingling, or aphasia. Head CT revealed 3 cm solitary, right  Pterional dural-based mass, likely meningioma, with significant associated vasogenic edema and resulting 14 mm of midline shift. She was admitted to the [facility] Neurosurgery service. Her surgery on 4/29 was delayed as she was not NPO prior. She continued to do well until 5/4, when she was noted to have episodes of bradycardia overnight. She was then found to be unresponsive at 7AM; Rapid response was called, pt was emergently transferred to ICU, intubated, and PCCM was consulted.   Past Medical History  Gestational DM  Allergic Rhinitis   Significant Hospital Events   4/27: Admitted  4/29: Neurosurgery delayed d/t not NPO 5/4: Acute decompensation and intubation  5/4: Decompressive R frontotemporoparietal craniectomy for resection of R frontal meningioma; placement of bone flap into subcutaneous abdominal pocket 5/8 poor neuro status.  Primary team talking to family regarding goals of care.  Consults:  Neurosurgery (primary) PCCM  Procedures:  5/4: ETT emergently  5/4: Decompressive R frontotemporoparietal craniectomy for resection of R frontal meningioma; placement of bone flap into subcutaneous abdominal pocket 5/4: CVC placement, RIJ  5/4: A line  Significant  Diagnostic Tests:  4/27 Head CT Noncon:  1. Right frontal lobe severe vasogenic edema and 10 mm of leftward midline shift, effacement of the basilar cisterns and impending subfalcine herniation of the cingulate gyrus. 2. Partially calcified mass abutting the right frontal lobe is likely a meningioma. While this may be a source of the right frontal edema, there also could be an intraparenchymal lesion not visible by CT. MRI of the head with and without contrast might be helpful for further evaluation. 3. No acute hemorrhage.  4/28: Brain MRI W WO Con:  3 cm solitary, right pterional dural based mass favoring meningioma. There is contiguous profound vasogenic edema with 14 mm of midline shift and right uncal herniation.  5/4 CT Head Noncon:  Stable size of meningioma more fully characterized on prior MRI. There is persistent marked edema and mass effect, with further progression since 09/09/2019. This includes subfalcine, uncal, and cerebellar tonsillar herniation with trapping of the herniated temporal horn. There may be mild trapping of the posterior left lateral ventricle.  Micro Data:  4/29: MRSA PCR negative  Antimicrobials:  5/4: Ancef 2g preop ppx   Interim history/subjective:  5/12: appreciate palliative support. Family requesting second opinion, defer to neurosx to delineate this. Cont with med management, increase tf rate 5/11: no events overnight. Family meeting set for today.  5/10: Slight hypothermic 96 on Saturday, euthermic last 24 hours Hypertensive Urine output adequate  Objective   Blood pressure (!) 150/91, pulse 65, temperature 98.1 F (36.7 C), resp. rate 18, height 5\' 5"  (1.651 m), weight 115.4 kg, SpO2 100 %.    Vent Mode: PRVC FiO2 (%):  [18 %-40 %] 40 %  Set Rate:  [18 bmp] 18 bmp Vt Set:  [450 mL] 450 mL PEEP:  [5 cmH20] 5 cmH20 Plateau Pressure:  [12 cmH20-15 cmH20] 12 cmH20   Intake/Output Summary (Last 24 hours) at 10/16/2019 1012 Last data  filed at 10/16/2019 1000 Gross per 24 hour  Intake 490 ml  Output 1450 ml  Net -960 ml   Filed Weights   09/26/2019 2117 09/16/2019 0122 10/14/19 0448  Weight: 114.3 kg 117 kg 115.4 kg    General elderly woman, orally intubated, unresponsive HEENT normocephalic atraumatic surgical site unremarkable, sclera anicteric Pulmonary: rhonchi bilaterally today on full ventilatory support Cardiac: Regular rate and rhythm Extremities: Warm dry Abdomen soft nontender, bs decreased Neuro: Comatose, GCS 3, right pupil 4 mm not reactive, left 2 mm not reactive, doll's eye absent, triggers went when RR turned down to 10, + babinski and decerebrate posturing with painful stim  Resolved Hospital Problem list   none  Assessment & Plan:  Ms. Thyra Armour is a 64 year old female who presented for progressive headache and confusion found to have 3 cm solitary, right pterional dural based mass c/f meningioma.   Right meningioma with cerebral edema, and brain herniation status post resection -Remains comatose with no improvement -Hypertonic saline discontinued Plan Continue supportive care  Continue Decadron and Keppra  -consider stopping keppra as it has been 7 days.    Acute respiratory failure in the setting of inability to protect airway from devastating brain injury Plan Continue full ventilatory support she is not a candidate for extubation 2/2 mental status VAP bundle   Hypernatremia, hyperchloremia -induced by 3% saline Plan Follow Slowly drifting down -recheck labs in am  Constipation:  -increasing bowel regimen  Goals of care -being formulated by neurosurgery and palliative care involved now.  Continue supportive care meantime.  Family requesting second opinion, ccm defers to neurosx to facilitate that. Anticipate withdrawal of life support once family has come to terms with her dismal neuro prognosis   Best practice:  Diet: increase tf rate per nutrition recs.    Pain/Anxiety/Delirium protocol (if indicated): Fentanyl as needed VAP protocol (if indicated): Per protocol  DVT prophylaxis: heparin  GI prophylaxis: Protonix Glucose control: SSI Mobility: Intubated, bedrest  Code Status: FULL  Family Communication: Per NSGY Disposition: ICU   Critical care time: The patient is critically ill with multiple organ systems failure and requires high complexity decision making for assessment and support, frequent evaluation and titration of therapies, application of advanced monitoring technologies and extensive interpretation of multiple databases.  Critical care time 31 mins. This represents my time independent of the NPs time taking care of the pt. This is excluding procedures.    [facility] 10/16/2019, 10:12 AM

## 2019-10-17 ENCOUNTER — Inpatient Hospital Stay (HOSPITAL_COMMUNITY): Payer: Self-pay

## 2019-10-17 DIAGNOSIS — Z515 Encounter for palliative care: Secondary | ICD-10-CM

## 2019-10-17 DIAGNOSIS — G935 Compression of brain: Secondary | ICD-10-CM

## 2019-10-17 LAB — GLUCOSE, CAPILLARY
Glucose-Capillary: 236 mg/dL — ABNORMAL HIGH (ref 70–99)
Glucose-Capillary: 189 mg/dL — ABNORMAL HIGH (ref 70–99)
Glucose-Capillary: 221 mg/dL — ABNORMAL HIGH (ref 70–99)
Glucose-Capillary: 262 mg/dL — ABNORMAL HIGH (ref 70–99)
Glucose-Capillary: 246 mg/dL — ABNORMAL HIGH (ref 70–99)

## 2019-10-17 LAB — BASIC METABOLIC PANEL
Anion gap: 11 (ref 5–15)
BUN: 31 mg/dL — ABNORMAL HIGH (ref 8–23)
CO2: 29 mmol/L (ref 22–32)
Calcium: 9.4 mg/dL (ref 8.9–10.3)
Chloride: 103 mmol/L (ref 98–111)
Creatinine, Ser: 0.77 mg/dL (ref 0.44–1.00)
GFR calc Af Amer: >60 mL/min (ref >60)
GFR calc non Af Amer: >60 mL/min (ref >60)
Glucose, Bld: 215 mg/dL — ABNORMAL HIGH (ref 70–99)
Potassium: 4.6 mmol/L (ref 3.5–5.1)
Sodium: 143 mmol/L (ref 135–145)

## 2019-10-17 LAB — CBC
HCT: 46.3 % — ABNORMAL HIGH (ref 36.0–46.0)
Hemoglobin: 14.5 g/dL (ref 12.0–15.0)
MCH: 28.6 pg (ref 26.0–34.0)
MCHC: 31.3 g/dL (ref 30.0–36.0)
MCV: 91.3 fL (ref 80.0–100.0)
Platelets: 195 10*3/uL (ref 150–400)
RBC: 5.07 MIL/uL (ref 3.87–5.11)
RDW: 14.9 % (ref 11.5–15.5)
WBC: 17.9 10*3/uL — ABNORMAL HIGH (ref 4.0–10.5)
nRBC: 0.2 % (ref 0.0–0.2)

## 2019-10-17 LAB — MAGNESIUM: Magnesium: 1.9 mg/dL (ref 1.7–2.4)

## 2019-10-17 LAB — PROCALCITONIN: Procalcitonin: <0.1 ng/mL

## 2019-10-17 MED ORDER — VITAL HIGH PROTEIN PO LIQD: 1000.0000 mL | ORAL | Status: DC

## 2019-10-17 MED ORDER — INSULIN ASPART 100 UNIT/ML ~~LOC~~ SOLN
0.0000 [IU] | SUBCUTANEOUS | Status: DC
  Administered 2019-10-17: 5 [IU] via SUBCUTANEOUS
  Administered 2019-10-17: 3 [IU] via SUBCUTANEOUS
  Administered 2019-10-17: 8 [IU] via SUBCUTANEOUS
  Administered 2019-10-17: 5 [IU] via SUBCUTANEOUS

## 2019-10-17 MED ORDER — FENTANYL CITRATE (PF) 100 MCG/2ML IJ SOLN
50.0000 ug | INTRAMUSCULAR | Status: DC | PRN
  Administered 2019-10-20: 100 ug via INTRAVENOUS
  Administered 2019-10-23: 50 ug via INTRAVENOUS
  Filled 2019-10-17 (×2): qty 2

## 2019-10-17 NOTE — Progress Notes (Signed)
NAME:  Kimberly Daniel, MRN:  [id number], DOB:  March 30, 1956, LOS: 16 ADMISSION DATE:  09/14/2019, CONSULTATION DATE:  10/16/2019 REFERRING MD:  Kathyrn Sheriff MD, CHIEF COMPLAINT:  Meningioma   Brief History   Ms. Kimberly Daniel is a 64 yo F with a h/o gestational DM and allergic rhinitis who presented to [facility] ED with 1 week of headache, gait instability, and confusion. She had no prior history of consistent headaches. She began to have memory difficulties, including forgetting where she was driving, misplacing meaningful items, etc. This prompted her to seek care in the ED. On presentation, vitals were stable. She did not have changes in vision, dizziness, numbness, tingling, or aphasia. Head CT revealed 3 cm solitary, right  Pterional dural-based mass, likely meningioma, with significant associated vasogenic edema and resulting 14 mm of midline shift. She was admitted to the [facility] Neurosurgery service. Her surgery on 4/29 was delayed as she was not NPO prior. She continued to do well until 5/4, when she was noted to have episodes of bradycardia overnight. She was then found to be unresponsive at 7AM; Rapid response was called, pt was emergently transferred to ICU, intubated, and PCCM was consulted.   Past Medical History  Gestational DM  Allergic Rhinitis   Significant Hospital Events   4/27: Admitted  4/29: Neurosurgery delayed d/t not NPO 5/4: Acute decompensation and intubation  5/4: Decompressive R frontotemporoparietal craniectomy for resection of R frontal meningioma; placement of bone flap into subcutaneous abdominal pocket 5/8 poor neuro status.  Primary team talking to family regarding goals of care.  Consults:  Neurosurgery (primary) PCCM  Procedures:  5/4: ETT emergently  5/4: Decompressive R frontotemporoparietal craniectomy for resection of R frontal meningioma; placement of bone flap into subcutaneous abdominal pocket 5/4: CVC placement, RIJ  5/4: A line  Significant  Diagnostic Tests:  4/27 Head CT Noncon:  1. Right frontal lobe severe vasogenic edema and 10 mm of leftward midline shift, effacement of the basilar cisterns and impending subfalcine herniation of the cingulate gyrus. 2. Partially calcified mass abutting the right frontal lobe is likely a meningioma. While this may be a source of the right frontal edema, there also could be an intraparenchymal lesion not visible by CT. MRI of the head with and without contrast might be helpful for further evaluation. 3. No acute hemorrhage.  4/28: Brain MRI W WO Con:  3 cm solitary, right pterional dural based mass favoring meningioma. There is contiguous profound vasogenic edema with 14 mm of midline shift and right uncal herniation.  5/4 CT Head Noncon:  Stable size of meningioma more fully characterized on prior MRI. There is persistent marked edema and mass effect, with further progression since 09/25/2019. This includes subfalcine, uncal, and cerebellar tonsillar herniation with trapping of the herniated temporal horn. There may be mild trapping of the posterior left lateral ventricle.  5/12 cth: Postop resection of right frontal meningioma. 2.5 cm area of acute hemorrhage in the resection cavity. There is extensive edema in the right frontal lobe with mass-effect. 9 mm midline shift to left improved from the preoperative study. There is downward herniation through the tentorium and foramen magnum. Effacement of the basilar cisterns with uncal herniation.  Hypodensities are present in multiple areas most likely acute infarct com involving the thalamus, pons, bilateral occipital lobe, and right frontal cortex.  Subdural hematoma along the right tentorium and floor of the middle cranial fossa on the right. Small subdural hematoma along the falx posteriorly.  Micro Data:  4/29: MRSA PCR negative  Antimicrobials:  5/4: Ancef 2g preop ppx   Interim history/subjective:  5/13:  hyperglycemic overnight with increased rate of tf (tolerating). Increased ssi. Repeat cth with herniation thru the foramen magnum noted and multiple hypodensities. Neuro gave second opinion at family request and unfortunately concurs no meaningful recovery expected. cxr pending with increased wbc.  5/12: appreciate palliative support. Family requesting second opinion, defer to neurosx to delineate this. Cont with med management, increase tf rate 5/11: no events overnight. Family meeting set for today.  5/10: Slight hypothermic 96 on Saturday, euthermic last 24 hours Hypertensive Urine output adequate  Objective   Blood pressure (!) 162/91, pulse 69, temperature 97.9 F (36.6 C), resp. rate 18, height 5\' 5"  (1.651 m), weight 115.4 kg, SpO2 100 %.    Vent Mode: PRVC FiO2 (%):  [40 %-100 %] 40 % Set Rate:  [18 bmp] 18 bmp Vt Set:  [450 mL] 450 mL PEEP:  [5 cmH20] 5 cmH20 Plateau Pressure:  [12 cmH20-14 cmH20] 14 cmH20   Intake/Output Summary (Last 24 hours) at 10/17/2019 0758 Last data filed at 10/17/2019 0630 Gross per 24 hour  Intake 972.5 ml  Output 1225 ml  Net -252.5 ml   Filed Weights   09/07/2019 2117 09/28/2019 0122 10/14/19 0448  Weight: 114.3 kg 117 kg 115.4 kg    General elderly woman, orally intubated, unresponsive HEENT normocephalic atraumatic surgical site unremarkable, sclera anicteric Pulmonary: rhonchi bilaterally today on full ventilatory support Cardiac: Regular rate and rhythm Extremities: Warm dry Abdomen soft nontender, bs decreased Neuro: Comatose, GCS 3, right pupil 4 mm not reactive, left 2 mm not reactive, doll's eye absent, triggers went when RR turned down to 10, + babinski and decerebrate posturing with painful stim  Resolved Hospital Problem list   none  Assessment & Plan:  Ms. Kimberly Daniel is a 64 year old female who presented for progressive headache and confusion found to have 3 cm solitary, right pterional dural based mass c/f meningioma.   Right  meningioma with cerebral edema, and brain herniation status post resection -Remains comatose with no improvement -Hypertonic saline discontinued Plan Continue supportive care  Continue Decadron and Keppra  -appreciate neuro as second opinion as well.    Acute respiratory failure in the setting of inability to protect airway from devastating brain injury Plan Continue full ventilatory support she is not a candidate for extubation 2/2 mental status VAP bundle -repeat cxr with increased wbc -thankfully afebrile and no increase in oxygen requirement.    Hypernatremia, hyperchloremia -induced by 3% saline Plan Follow resolved  Constipation:  -increasing bowel regimen  Prediabetes with hyperglycemia:  -ssi  Goals of care -being formulated by neurosurgery and palliative care involved now.  Continue supportive care meantime.  Family requested second opinion, which was provided by neuro.  Anticipate withdrawal of life support once family has come to terms with her dismal neuro prognosis   Best practice:  Diet: increase tf rate per nutrition recs.  Pain/Anxiety/Delirium protocol (if indicated): Fentanyl as needed VAP protocol (if indicated): Per protocol  DVT prophylaxis: heparin  GI prophylaxis: Protonix Glucose control: SSI Mobility: Intubated, bedrest  Code Status: FULL  Family Communication: Per NSGY Disposition: ICU   Critical care time: The patient is critically ill with multiple organ systems failure and requires high complexity decision making for assessment and support, frequent evaluation and titration of therapies, application of advanced monitoring technologies and extensive interpretation of multiple databases.  Critical care time 32 mins. This represents  my time independent of the NPs time taking care of the pt. This is excluding procedures.    [facility] 10/17/2019, 7:58 AM

## 2019-10-17 NOTE — Progress Notes (Addendum)
  NEUROSURGERY PROGRESS NOTE   No issues overnight.  EXAM:  BP (!) 162/91   Pulse 69   Temp 97.9 F (36.6 C)   Resp 18   Ht 5\' 5"  (1.651 m)   Wt 115.4 kg   SpO2 100%   BMI 42.34 kg/m   Intubated Initiates some breaths Right pupil 50mm non-reactive, left 74mm non-reactive (-) cough/gag Extensor posturing BLE to pain    IMPRESSION/PLAN 64 y.o. female Comatose without hope for meaningful recovery. Neurology input appreciated. Ongoing family discussions continue. Appreciate palliative cares assistance.   =================================================================================================================== I have seen and examined Kimberly Daniel and agree with the exam, impression, and plan as documented in the note above by [facility], PA-C.  Pt is s/p craniectomy for malignant edema related to WHO grade 1 meningioma causing severe brainstem dysfunction. No change in clinical exam suggest extremely poor prognosis. Appreciate neurology consult who agree prognosis is dismal. Will try to be available for family meeting with Palliative care to discuss goals of care.   Consuella Lose, MD [facility]

## 2019-10-17 NOTE — Progress Notes (Signed)
Daily Progress Note   Patient Name: Kimberly Daniel       Date: 10/17/2019 DOB: 04-26-1956  Age: 64 y.o. MRN#: [id number] Attending Physician: Consuella Lose, MD Primary Care Physician: Kristen Loader, FNP Admit Date: 10/03/2019  Reason for Consultation/Follow-up: To discuss complex medical decision making related to patient's goals of care  Subjective: Spoke with Randall Hiss (son) to arrange family meeting today.  Randall Hiss explains that his mother has 3 children and then married Mr. Laurance Flatten who has 3 daughters.  The patient is a devout church going [place] who loves [employer] gospel music.   She loved to travel and enjoyed a little bit of gambling sweepstakes.  Per Randall Hiss she cared for everyone else before she considered herself.    Family meeting was held at 5 pm.  3 sons Randall Hiss, Legrand Como, and Hercules) along with patient's sister Shirlean Mylar attended.  Drs Kathyrn Sheriff and Leonel Ramsay were present along with ICU RN Marlowe Kays and myself.  Family asked questions about what happened and whether or not the outcome would have been different if the surgery was done earlier.  Additional questions were asked about treatment, prognosis and potential for recovery.    All questions were answered to the best of our ability.  We discussed shift to comfort and advised the family that from a medical perspective we recommend a change in code status to DNR.  The family asked for time to speak privately.  After which they expressed to me that they agreed with changing code status to DNR.  They would like for her to continue to receive vent support with a focus on optimizing her comfort until family can visit.  We discussed that she may pass on her own even on life support.  Family understands.  They will work on a list of  people who will visit, but for now they request that visitation be restricted to Karoline Caldwell, Emi Belfast, [employer] and [employer].  Family expressed appreciation to me for the meeting.  Assessment: 70 yof who was independent prior to admission.  Found to have large meningioma.  Underwent emergent surgery after tonsillar herniation and right uncal herniation.  CT 10/16/19 shows multiple hypo densities consistent with stroke in the thalamus, pons, bilateral occipital lobes, and right frontal cortex.  Patient is comatose with a very poor  prognosis.  Very tragic situation.   Patient Profile/HPI:  64 y.o. female  with past medical history of COPD, diabetes admitted on 09/18/2019 with dizziness, right sided headaches, gait instability, memory changes/confusion and found to have right frontal brain mass (likely meningioma) with 81mm midline shift and significant vasogenic edema. She was placed on steroids and had emergent surgery 5/4 after an acute decline requiring intubation. She has not had any further signs of improvement and neurological assessment is extremely poor.      Length of Stay: 15   Vital Signs: BP (!) 155/93   Pulse 73   Temp 98.2 F (36.8 C)   Resp 18   Ht 5\' 5"  (1.651 m)   Wt 115.4 kg   SpO2 99%   BMI 42.34 kg/m  SpO2: SpO2: 99 % O2 Device: O2 Device: Ventilator O2 Flow Rate: O2 Flow Rate (L/min): 2 L/min       Palliative Assessment/Data: 10%     Palliative Care Plan    Recommendations/Plan:  PMT will continue to follow with you.  Code status changed to DNR  Comfort measures only.  Allow patient to remain supported by the vent until family is able to visit.  Measures not aimed at comfort will be discontinued (other than vent)  Randall Hiss will provide [place] a list of visitors allowed.  Code Status:  DNR  Prognosis:   < 2 weeks   Discharge Planning:  Anticipated Hospital Death  Care plan was discussed with Drs. Doroteo Bradford, ICU RN,  family.  Thank you for allowing the Palliative Medicine Team to assist in the care of this patient.  Total time spent:  120 min.     Greater than 50%  of this time was spent counseling and coordinating care related to the above assessment and plan.  Florentina Jenny, PA-C Palliative Medicine  Please contact Palliative MedicineTeam phone at [phone] for questions and concerns between 7 am - 7 pm.   Please see AMION for individual provider pager numbers.

## 2019-10-17 NOTE — Progress Notes (Signed)
[facility] Progress Note Patient Name: Manveer Bisset DOB: 13-Apr-1956 MRN: [id number]   Date of Service  10/17/2019  HPI/Events of Note  Hyperglycemia - Blood glucose = 200 --> 229 --> 236.  eICU Interventions  Will order: 1. Q 4 hour moderate Novolog SSI.     Intervention Category Major Interventions: Hyperglycemia - active titration of insulin therapy  Lysle Dingwall 10/17/2019, 3:39 AM

## 2019-10-18 MED ORDER — LORAZEPAM 2 MG/ML IJ SOLN
0.2500 mg | Freq: Four times a day (QID) | INTRAMUSCULAR | Status: DC
  Administered 2019-10-18 – 2019-10-22 (×15): 0.25 mg via INTRAVENOUS
  Filled 2019-10-18 (×15): qty 1

## 2019-10-18 NOTE — TOC Progression Note (Signed)
Transition of Care [facility]) - Progression Note    Patient Details  Name: Kimberly Daniel MRN: [id number] Date of Birth: 03-27-1956  Transition of Care [facility]) CM/SW Contact  Oren Section Cleta Alberts, RN Phone Number: 10/18/2019, 2:16 PM  Clinical Narrative:   Pt is s/p craniectomy for malignant edema related to WHO grade 1 meningioma causing severe brainstem dysfunction. No change in clinical exam suggest extremely poor prognosis.  CM consult to assist son, Randall Hiss with questions he has regarding applying for Medicaid to assist with medical bills.  Notified [employer] in financial counseling, who will have counselor contact Randall Hiss to assist.       Barriers to Discharge: Continued Medical Work up  Expected Discharge Plan and Services     Discharge Planning Services: CM Consult   Living arrangements for the past 2 months: Single Family Home                                       Social Determinants of Health (SDOH) Interventions    Readmission Risk Interventions No flowsheet data found.  Reinaldo Raddle, RN, BSN  Trauma/Neuro ICU Case Manager [phone]

## 2019-10-18 NOTE — Progress Notes (Signed)
  NEUROSURGERY PROGRESS NOTE   No issues overnight.   EXAM:  BP 139/83   Pulse (!) 59   Temp 97.7 F (36.5 C)   Resp 17   Ht 5\' 5"  (1.651 m)   Wt 115.4 kg   SpO2 99%   BMI 42.34 kg/m   Intubated Initiates some breaths Right pupil 53mm non-reactive, left94mm non-reactive (-) cough/gag Extensor posturing BLE to pain   IMPRESSION/PLAN 64 y.o. female s/p craniectomy for malignant edema related to [facility] grade 1 meningioma causing severe brainstem dysfunction. No change in clinical exam suggest extremely poor prognosis. Patient is now DNR and will be transitioning to comfort care.

## 2019-10-18 NOTE — Progress Notes (Signed)
NAME:  Kimberly Daniel, MRN:  [id number], DOB:  05/15/1956, LOS: 69 ADMISSION DATE:  09/10/2019, CONSULTATION DATE:  10/29/2019 REFERRING MD:  Kathyrn Sheriff MD, CHIEF COMPLAINT:  Meningioma   Brief History   Ms. Kimberly Daniel is a 64 yo F with a h/o gestational DM and allergic rhinitis who presented to [facility] ED with 1 week of headache, gait instability, and confusion. She had no prior history of consistent headaches. She began to have memory difficulties, including forgetting where she was driving, misplacing meaningful items, etc. This prompted her to seek care in the ED. On presentation, vitals were stable. She did not have changes in vision, dizziness, numbness, tingling, or aphasia. Head CT revealed 3 cm solitary, right  Pterional dural-based mass, likely meningioma, with significant associated vasogenic edema and resulting 14 mm of midline shift. She was admitted to the [facility] Neurosurgery service. Her surgery on 4/29 was delayed as she was not NPO prior. She continued to do well until 5/4, when she was noted to have episodes of bradycardia overnight. She was then found to be unresponsive at 7AM; Rapid response was called, pt was emergently transferred to ICU, intubated, and PCCM was consulted.   Past Medical History  Gestational DM  Allergic Rhinitis   Significant Hospital Events   4/27: Admitted  4/29: Neurosurgery delayed d/t not NPO 5/4: Acute decompensation and intubation  5/4: Decompressive R frontotemporoparietal craniectomy for resection of R frontal meningioma; placement of bone flap into subcutaneous abdominal pocket 5/8 poor neuro status.  Primary team talking to family regarding goals of care. 5/14: DNR with emphasis on comfort care.   Consults:  Neurosurgery (primary) PCCM Palliative   Procedures:  5/4: ETT emergently  5/4: Decompressive R frontotemporoparietal craniectomy for resection of R frontal meningioma; placement of bone flap into subcutaneous abdominal  pocket 5/4: CVC placement, RIJ  5/4: A line  Significant Diagnostic Tests:  4/27 Head CT Noncon:  1. Right frontal lobe severe vasogenic edema and 10 mm of leftward midline shift, effacement of the basilar cisterns and impending subfalcine herniation of the cingulate gyrus. 2. Partially calcified mass abutting the right frontal lobe is likely a meningioma. While this may be a source of the right frontal edema, there also could be an intraparenchymal lesion not visible by CT. MRI of the head with and without contrast might be helpful for further evaluation. 3. No acute hemorrhage.  4/28: Brain MRI W WO Con:  3 cm solitary, right pterional dural based mass favoring meningioma. There is contiguous profound vasogenic edema with 14 mm of midline shift and right uncal herniation.  5/4 CT Head Noncon:  stable size of meningioma more fully characterized on prior MRI. There is persistent marked edema and mass effect, with further progression since 09/24/2019. This includes subfalcine, uncal, and cerebellar tonsillar herniation with trapping of the herniated temporal horn. There may be mild trapping of the posterior left lateral ventricle.  5/12 cth: Postop resection of right frontal meningioma. 2.5 cm area of acute hemorrhage in the resection cavity. There is extensive edema in the right frontal lobe with mass-effect. 9 mm midline shift to left improved from the preoperative study. There is downward herniation through the tentorium and foramen magnum. Effacement of the basilar cisterns with uncal herniation.  Hypodensities are present in multiple areas most likely acute infarct com involving the thalamus, pons, bilateral occipital lobe, and right frontal cortex.  Subdural hematoma along the right tentorium and floor of the middle cranial fossa on the right.  Small subdural hematoma along the falx posteriorly.  Micro Data:  4/29: MRSA PCR negative  Antimicrobials:  5/4: Ancef  2g preop ppx   Interim history/subjective:  5/14: appreciate palliative d/w family yesterday. DNR now and transition to comfort care with maintaining vent support until family can visit.  5/13: hyperglycemic overnight with increased rate of tf (tolerating). Increased ssi. Repeat cth with herniation thru the foramen magnum noted and multiple hypodensities. Neuro gave second opinion at family request and unfortunately concurs no meaningful recovery expected. cxr pending with increased wbc.  5/12: appreciate palliative support. Family requesting second opinion, defer to neurosx to delineate this. Cont with med management, increase tf rate 5/11: no events overnight. Family meeting set for today.  5/10: Slight hypothermic 96 on Saturday, euthermic last 24 hours Hypertensive Urine output adequate  Objective   Blood pressure 139/83, pulse (!) 59, temperature 97.7 F (36.5 C), resp. rate 17, height 5\' 5"  (1.651 m), weight 115.4 kg, SpO2 99 %.    Vent Mode: PRVC FiO2 (%):  [40 %] 40 % Set Rate:  [18 bmp] 18 bmp Vt Set:  [450 mL] 450 mL PEEP:  [5 cmH20] 5 cmH20 Plateau Pressure:  [13 cmH20-15 cmH20] 15 cmH20   Intake/Output Summary (Last 24 hours) at 10/18/2019 [id number] Last data filed at 10/18/2019 0800 Gross per 24 hour  Intake 789.67 ml  Output 1540 ml  Net -750.33 ml   Filed Weights   09/09/2019 2117 09/22/2019 0122 10/14/19 0448  Weight: 114.3 kg 117 kg 115.4 kg    General elderly woman, orally intubated, unresponsive HEENT normocephalic atraumatic surgical site unremarkable, sclera anicteric Pulmonary: rhonchi bilaterally today on full ventilatory support Cardiac: Regular rate and rhythm Extremities: Warm dry Abdomen soft nontender, bs decreased Neuro: Comatose, GCS 3, right pupil 4 mm not reactive, left 2 mm not reactive, doll's eye absent, triggers went when RR turned down to 10, + babinski and decerebrate posturing with painful stim  Resolved Hospital Problem list   none  Assessment  & Plan:  Ms. Kimberly Daniel is a 64 year old female who presented for progressive headache and confusion found to have 3 cm solitary, right pterional dural based mass c/f meningioma.   Right meningioma with cerebral edema, and brain herniation status post resection -Remains comatose with no improvement -Hypertonic saline discontinued Plan Continue supportive care with emphasis on comfort Continue Decadron and Keppra  -appreciate neuro as second opinion as well.    Acute respiratory failure in the setting of inability to protect airway from devastating brain injury Plan Continue full ventilatory support she is not a candidate for extubation 2/2 mental status VAP bundle -repeat cxr with improved aeration, personally reviewed by me   Hypernatremia, hyperchloremia -induced by 3% saline Plan Follow resolved  Constipation:  -holding bowel regimen  Prediabetes with hyperglycemia:  -stopped poc bs checks and insulin  Goals of care -focus on comfort but with vent support until family can visit   Best practice:  Diet: increase tf off Pain/Anxiety/Delirium protocol (if indicated): Fentanyl as needed VAP protocol (if indicated): Per protocol  DVT prophylaxis: heparin  GI prophylaxis: stopped per pallaitive Glucose control: no further checks per palliative Mobility: Intubated, bedrest  Code Status: DNR Family Communication: Per NSGY Disposition: ICU   Critical care time: The patient is critically ill with multiple organ systems failure and requires high complexity decision making for assessment and support, frequent evaluation and titration of therapies, application of advanced monitoring technologies and extensive interpretation of multiple databases.  Critical  care time 31 mins. This represents my time independent of the NPs time taking care of the pt. This is excluding procedures.    [facility] 10/18/2019, 9:22 AM

## 2019-10-18 NOTE — Progress Notes (Signed)
Daily Progress Note   Patient Name: Kimberly Daniel       Date: 10/18/2019 DOB: 10/21/1955  Age: 64 y.o. MRN#: [id number] Attending Physician: Consuella Lose, MD Primary Care Physician: Kristen Loader, FNP Admit Date: 09/28/2019  Reason for Consultation/Follow-up: Terminal care, symptom management and further discussion with family regarding end of life.  Clarified [employer] is 3 people at bedside who are allowed to rotate out.  At this point family only wants 3 sons and husband to be allowed to visit.  Family will update [place] shortly with a list of who will be allowed to visit to say goodbye before compassionate wean from the vent.  Per Randall Hiss he is going to try to bring Mr. Laurance Flatten (husband) up to visit the patient tomorrow.  He is still working to communicate with the rest of the family and offer them a time to visit.  He indicated that he is trying to ensure they have an opportunity to visit before its time to liberate her from the vent.    Randall Hiss expressed concern about his mother's comfort.  He does not want her to suffer.  She considered him her "rock" and depended on him to manage things when she was unable to do so.    Randall Hiss also expressed concern over making certain his mother's bills are paid.  Hence he is hopeful to hear from financial counseling with [facility].  Subjective: Patient intubated and comatose.  Discussed with ICU RN Sarah at bedside.  Tube feeds and  IVF discontinued as patient is retaining fluid and has significant swelling of her extremities.  Excess artificial  fluids at EOL will make her very uncomfortable and should be avoided.  Discussed family visitation.  Assessment: Patient intubated and comatose.  Vitals stable on life support.  Patient not initiating  breaths over the vent.   Patient Profile/HPI:  64 y.o.femalewith past medical history of COPD, diabetesadmitted on 4/27/2021with dizziness, right sided headaches, gait instability, memory changes/confusion and found to have right frontal brain mass (likely meningioma) with 8mm midline shift and significant vasogenic edema.She was placed on steroids and had emergent surgery 5/4 after an acute decline requiring intubation. She has not had any further signs of improvement and neurological assessment is extremely poor.     Length of Stay: 16  Vital Signs: BP 137/84   Pulse 62   Temp 98.4 F (36.9 C)   Resp 17   Ht 5\' 5"  (1.651 m)   Wt 115.4 kg   SpO2 100%   BMI 42.34 kg/m  SpO2: SpO2: 100 % O2 Device: O2 Device: Ventilator O2 Flow Rate: O2 Flow Rate (L/min): 2 L/min       Palliative Assessment/Data: 10%     Palliative Care Plan    Recommendations/Plan:  Focus on comfort.    Continue vent support while family is able to visit.  Family will update [place] with a list to people to visit.  They do not want to leave their mother in this uncomfortable state any longer than necessary.  Family requested grief counseling.  [facility] information provided.  Encouraged them to contact [facility] even before [name] passes away as the counseling may be helpful to get thru that process.  Low dose anti-anxiety initiated to help ensure comfort and remain consistent with best practices for end of life care.  Family requested information on obtaining Medicaid for the patient.  TOC order placed.  Discussed with Production assistant, radio.  I believe they are trying to ensure her hospital bills will be paid.  Would appreciate assistance from financial counseling.  PMT will continue to follow for symptom management and family support.  Code Status:  DNR  Prognosis:   < 2 weeks   Discharge Planning:  Anticipated Hospital Death  Care plan was discussed with RN and family.  Thank you  for allowing the Palliative Medicine Team to assist in the care of this patient.  Total time spent:  35 min.     Greater than 50%  of this time was spent counseling and coordinating care related to the above assessment and plan.  Florentina Jenny, PA-C Palliative Medicine  Please contact Palliative MedicineTeam phone at [phone] for questions and concerns between 7 am - 7 pm.   Please see AMION for individual provider pager numbers.

## 2019-10-18 NOTE — Progress Notes (Signed)
Nutrition Brief Note  Chart reviewed. Noted change in status to DNR and plan to transition to comfort care.  No further nutrition interventions planned at this time.  Please re-consult as needed.   Lockie Pares., RD, LDN, CNSC See AMiON for contact information

## 2019-10-19 NOTE — Progress Notes (Signed)
NAME:  Kimberly Daniel, MRN:  [id number], DOB:  April 25, 1956, LOS: 31 ADMISSION DATE:  09/26/2019, CONSULTATION DATE:  10/18/2019 REFERRING MD:  Kathyrn Sheriff MD, CHIEF COMPLAINT:  Meningioma   Brief History   Kimberly Daniel is a 64 yo F with a h/o gestational DM and allergic rhinitis who presented to [facility] ED with 1 week of headache, gait instability, and confusion. She had no prior history of consistent headaches. She began to have memory difficulties, including forgetting where she was driving, misplacing meaningful items, etc. This prompted her to seek care in the ED. On presentation, vitals were stable. She did not have changes in vision, dizziness, numbness, tingling, or aphasia. Head CT revealed 3 cm solitary, right  Pterional dural-based mass, likely meningioma, with significant associated vasogenic edema and resulting 14 mm of midline shift. She was admitted to the [facility] Neurosurgery service. Her surgery on 4/29 was delayed as she was not NPO prior. She continued to do well until 5/4, when she was noted to have episodes of bradycardia overnight. She was then found to be unresponsive at 7AM; Rapid response was called, pt was emergently transferred to ICU, intubated, and PCCM was consulted.   Past Medical History  Gestational DM  Allergic Rhinitis   Significant Hospital Events   4/27: Admitted  4/29: Neurosurgery delayed d/t not NPO 5/4: Acute decompensation and intubation  5/4: Decompressive R frontotemporoparietal craniectomy for resection of R frontal meningioma; placement of bone flap into subcutaneous abdominal pocket 5/8 poor neuro status.  Primary team talking to family regarding goals of care. 5/14: DNR with emphasis on comfort care.   Consults:  Neurosurgery (primary) Palliative   Procedures:  5/4: ETT emergently  5/4: Decompressive R frontotemporoparietal craniectomy for resection of R frontal meningioma; placement of bone flap into subcutaneous abdominal pocket 5/4: CVC  placement, RIJ   Significant Diagnostic Tests:  4/27 Head CT Noncon:  1. Right frontal lobe severe vasogenic edema and 10 mm of leftward midline shift, effacement of the basilar cisterns and impending subfalcine herniation of the cingulate gyrus. 2. Partially calcified mass abutting the right frontal lobe is likely a meningioma. While this may be a source of the right frontal edema, there also could be an intraparenchymal lesion not visible by CT. MRI of the head with and without contrast might be helpful for further evaluation. 3. No acute hemorrhage.  4/28: Brain MRI W WO Con:  3 cm solitary, right pterional dural based mass favoring meningioma. There is contiguous profound vasogenic edema with 14 mm of midline shift and right uncal herniation.  5/4 CT Head Noncon:  stable size of meningioma more fully characterized on prior MRI. There is persistent marked edema and mass effect, with further progression since 09/08/2019. This includes subfalcine, uncal, and cerebellar tonsillar herniation with trapping of the herniated temporal horn. There may be mild trapping of the posterior left lateral ventricle.  5/12 cth: Postop resection of right frontal meningioma. 2.5 cm area of acute hemorrhage in the resection cavity. There is extensive edema in the right frontal lobe with mass-effect. 9 mm midline shift to left improved from the preoperative study. There is downward herniation through the tentorium and foramen magnum. Effacement of the basilar cisterns with uncal herniation.  Hypodensities are present in multiple areas most likely acute infarct com involving the thalamus, pons, bilateral occipital lobe, and right frontal cortex.  Subdural hematoma along the right tentorium and floor of the middle cranial fossa on the right. Small subdural hematoma along  the falx posteriorly.  Micro Data:  4/29: MRSA PCR negative  Antimicrobials:  5/4: Ancef 2g preop ppx   Interim  history/subjective:  No respiratory effort with pressure support.  Objective   Blood pressure (!) 149/87, pulse 68, temperature 98.4 F (36.9 C), resp. rate 18, height 5\' 5"  (1.651 m), weight 115.4 kg, SpO2 99 %.    Vent Mode: PRVC FiO2 (%):  [40 %] 40 % Set Rate:  [18 bmp] 18 bmp Vt Set:  [450 mL] 450 mL PEEP:  [5 cmH20] 5 cmH20 Plateau Pressure:  [14 cmH20-15 cmH20] 14 cmH20   Intake/Output Summary (Last 24 hours) at 10/19/2019 [id number] Last data filed at 10/19/2019 0500 Gross per 24 hour  Intake 160 ml  Output 575 ml  Net -415 ml   Filed Weights   09/30/2019 2117 09/20/2019 0122 10/14/19 0448  Weight: 114.3 kg 117 kg 115.4 kg     General - unresponsive Eyes - pupils midpoint ENT - ETT in place Cardiac - regular rate/rhythm, no murmur Chest - equal breath sounds b/l, no wheezing or rales Abdomen - soft, non tender, + bowel sounds Extremities - no cyanosis, clubbing, or edema Skin - no rashes Neuro - no response to stimuli  Resolved Hospital Problem list   Medically induced hypernatremia  Assessment & Plan:   Acute hypoxic respiratory failure with compromised airway. Hx of COPD. - continue vent support pending family decision for goals of care  Rt meningioma with cerebral edema and brain herniation s/p resection. - continue decadron, keppra per neurosurgery - post op care per neurosurgery  Goals of care. - DNR - palliative care consulted and assisting with clarification about who medical decision making should be for Kimberly Daniel  Best practice:  Diet: NPO DVT prophylaxis: SCDs GI prophylaxis: stopped per palliative care Mobility: bedrest  Code Status: DNR Disposition: ICU   Signature:  Chesley Mires, MD [place] Pager - [phone] 10/19/2019, 9:32 AM

## 2019-10-19 NOTE — Progress Notes (Addendum)
Daily Progress Note   Patient Name: Kimberly Daniel       Date: 10/19/2019 DOB: January 10, 1956  Age: 64 y.o. MRN#: [id number] Attending Physician: Consuella Lose, MD Primary Care Physician: Kristen Loader, FNP Admit Date: 09/17/2019  Reason for Consultation/Follow-up: Terminal care, symptom management and further discussion with family regarding end of life.  Today's Discussion:  Received a call from nursing staff this morning that patients step daughter had called to get information on the patient. She had told the staff that Kimberly Daniel husband who has advanced vascular dementia was to make decisions on the patient behalf. Given that all medical teams have been communicating with Kimberly Daniel the Palliative team communicated with him about this. He said that the patients step-daughter has not been in the know for good reason. He and his brothers plan to have a family meeting with her today.   In the meanwhile there have been no significant changes with the patient. The plan will be for the patients family to visit. Her son Kimberly Daniel concedes that after everyone who has wanted to come and spend time with the patient has the arrangements for compassionate extubation will take place.   Assessment: Patient intubated and comatose.  Vitals stable on life support.  Patient not initiating breaths over the vent.  Patient Profile/HPI:  64 y.o.femalewith past medical history of COPD, diabetesadmitted on 4/27/2021with dizziness, right sided headaches, gait instability, memory changes/confusion and found to have right frontal brain mass (likely meningioma) with 55mm midline shift and significant vasogenic edema.She was placed on steroids and had emergent surgery 5/4 after an acute decline requiring intubation. She  has not had any further signs of improvement and neurological assessment is extremely poor.   Length of Stay: 17  Vital Signs: BP 117/73   Pulse 73   Temp 99.3 F (37.4 C)   Resp 17   Ht 5\' 5"  (1.651 m)   Wt 115.4 kg   SpO2 99%   BMI 42.34 kg/m  SpO2: SpO2: 99 % O2 Device: O2 Device: Ventilator O2 Flow Rate: O2 Flow Rate (L/min): 2 L/min      Palliative Assessment/Data: 10%    Palliative Care Plan  Recommendations/Plan:  Focus on comfort.    Continue vent support while family is able to visit.  Family will update [place] with a list  to people to visit today.  They do not want to leave their mother in this uncomfortable state any longer than necessary.  Family requested grief counseling.  [facility] information provided.  Encouraged them to contact [facility] even before [name] passes away as the counseling may be helpful to get thru that process.  Low dose anti-anxiety initiated to help ensure comfort and remain consistent with best practices for end of life care.  Family requested information on obtaining Medicaid for the patient.  TOC order placed. Would appreciate assistance from financial counseling.  Family now has a code word to receive patient information  PMT will continue to follow for symptom management and family support.  Code Status:  DNR  Prognosis:   < 2 weeks   Discharge Planning:  Anticipated Hospital Death  Care plan was discussed with RN and family.  Thank you for allowing the Palliative Medicine Team to assist in the care of this patient.  Total time spent:  35 min.     Greater than 50%  of this time was spent counseling and coordinating care related to the above assessment and plan.  Tacey Ruiz, NP Palliative Medicine  Please contact Palliative MedicineTeam phone at [phone] for questions and concerns between 7 am - 7 pm.   Please see AMION for individual provider pager numbers.

## 2019-10-20 NOTE — Progress Notes (Signed)
NAME:  Kimberly Daniel, MRN:  [id number], DOB:  1956/02/12, LOS: 78 ADMISSION DATE:  09/23/2019, CONSULTATION DATE:  10/16/2019 REFERRING MD:  Kathyrn Sheriff MD, CHIEF COMPLAINT:  Meningioma   Brief History   64 yo female presented to [facility] ER with headache, gait instability and confusion for 1 week.  Also had memory difficulties.  CT head showed 3 cm Rt pterional dural based mass with vasogenic edema and 14 mm midline shift.  Developed bradycardia on 5/04 and then became unresponsive.  Required intubation and PCCM consulted.  Past Medical History  Gestational DM, Allergic Rhinitis   Significant Hospital Events   4/27: Admitted  4/29: Neurosurgery delayed d/t not NPO 5/4: Acute decompensation and intubation  5/4: Decompressive R frontotemporoparietal craniectomy for resection of R frontal meningioma; placement of bone flap into subcutaneous abdominal pocket 5/8 poor neuro status.  Primary team talking to family regarding goals of care. 5/14: DNR with emphasis on comfort care.   Consults:  Neurosurgery (primary) Palliative   Procedures:  5/4: ETT emergently  5/4: Decompressive R frontotemporoparietal craniectomy for resection of R frontal meningioma; placement of bone flap into subcutaneous abdominal pocket 5/4: CVC placement, RIJ   Significant Diagnostic Tests:  4/27 Head CT Noncon:  1. Right frontal lobe severe vasogenic edema and 10 mm of leftward midline shift, effacement of the basilar cisterns and impending subfalcine herniation of the cingulate gyrus. 2. Partially calcified mass abutting the right frontal lobe is likely a meningioma. While this may be a source of the right frontal edema, there also could be an intraparenchymal lesion not visible by CT. MRI of the head with and without contrast might be helpful for further evaluation. 3. No acute hemorrhage.  4/28: Brain MRI W WO Con:  3 cm solitary, right pterional dural based mass favoring meningioma. There is contiguous  profound vasogenic edema with 14 mm of midline shift and right uncal herniation.  5/4 CT Head Noncon:  stable size of meningioma more fully characterized on prior MRI. There is persistent marked edema and mass effect, with further progression since 09/24/2019. This includes subfalcine, uncal, and cerebellar tonsillar herniation with trapping of the herniated temporal horn. There may be mild trapping of the posterior left lateral ventricle.  5/12 cth: Postop resection of right frontal meningioma. 2.5 cm area of acute hemorrhage in the resection cavity. There is extensive edema in the right frontal lobe with mass-effect. 9 mm midline shift to left improved from the preoperative study. There is downward herniation through the tentorium and foramen magnum. Effacement of the basilar cisterns with uncal herniation.  Hypodensities are present in multiple areas most likely acute infarct com involving the thalamus, pons, bilateral occipital lobe, and right frontal cortex.  Subdural hematoma along the right tentorium and floor of the middle cranial fossa on the right. Small subdural hematoma along the falx posteriorly.  Micro Data:  4/29: MRSA PCR negative  Antimicrobials:  5/4: Ancef 2g preop ppx   Interim history/subjective:  No respiratory effort with SBT.  Objective   Blood pressure 117/76, pulse 64, temperature 98.4 F (36.9 C), resp. rate 18, height 5\' 5"  (1.651 m), weight 115.4 kg, SpO2 99 %.    Vent Mode: PRVC FiO2 (%):  [40 %] 40 % Set Rate:  [18 bmp] 18 bmp Vt Set:  [450 mL] 450 mL PEEP:  [5 cmH20] 5 cmH20 Plateau Pressure:  [14 cmH20] 14 cmH20   Intake/Output Summary (Last 24 hours) at 10/20/2019 0854 Last data filed at 10/20/2019 0800 Gross  per 24 hour  Intake --  Output 985 ml  Net -985 ml   Filed Weights   09/22/2019 2117 09/18/2019 0122 10/14/19 0448  Weight: 114.3 kg 117 kg 115.4 kg    General - unresponsive Eyes - Rt pupil dilated, Lt pupil midpoint ENT  - ETT in place Cardiac - regular rate/rhythm, no murmur Chest - equal breath sounds b/l, no wheezing or rales Abdomen - soft, non tender, + bowel sounds Extremities - no cyanosis, clubbing, or edema Skin - no rashes Neuro - no response to stimuli  Resolved Hospital Problem list   Medically induced hypernatremia  Assessment & Plan:   Acute hypoxic respiratory failure with compromised airway. Hx of COPD. - continue full vent support pending further goals of care discussion with family  Rt meningioma with cerebral edema and brain herniation s/p resection. - continue decadron, keppra per neurosurgery - post op care per neurosurgery  Goals of care. - DNR - palliative working with family - comfort measures seem like best option  Best practice:  Diet: NPO DVT prophylaxis: SCDs GI prophylaxis: stopped per palliative care Mobility: bedrest  Code Status: DNR Disposition: ICU   Signature:  Chesley Mires, MD [place] Pager - [phone] 10/20/2019, 8:54 AM

## 2019-10-20 NOTE — Progress Notes (Signed)
Daily Progress Note   Patient Name: Kimberly Daniel       Date: 10/20/2019 DOB: 06-30-1955  Age: 64 y.o. MRN#: [id number] Attending Physician: Consuella Lose, MD Primary Care Physician: Kristen Loader, FNP Admit Date: 09/18/2019  Reason for Consultation/Follow-up: Terminal care, symptom management and further discussion with family regarding end of life.  Today's Discussion (10/20/2019):  Chart reviewed. No significant changes with the patient.  Reached out to [employer] though no answer therefore left a VM to identify if everyone who has wanted to see [name] had done so. Awaiting his response to move forward with plans for compassionate extubation.   Assessment: Patient intubated and comatose.  Vitals stable on life support.  Patient not initiating breaths over the vent.  Patient Profile/HPI:  64 y.o.femalewith past medical history of COPD, diabetesadmitted on 4/27/2021with dizziness, right sided headaches, gait instability, memory changes/confusion and found to have right frontal brain mass (likely meningioma) with 32mm midline shift and significant vasogenic edema.She was placed on steroids and had emergent surgery 5/4 after an acute decline requiring intubation. She has not had any further signs of improvement and neurological assessment is extremely poor.   Length of Stay: 18  Vital Signs: BP (!) 97/55   Pulse 62   Temp (!) 97.3 F (36.3 C)   Resp (!) 21   Ht 5\' 5"  (1.651 m)   Wt 115.4 kg   SpO2 99%   BMI 42.34 kg/m  SpO2: SpO2: 99 % O2 Device: O2 Device: Ventilator O2 Flow Rate: O2 Flow Rate (L/min): 2 L/min      Palliative Assessment/Data: 10%    Palliative Care Plan  Recommendations/Plan:  Focus on comfort.    Continue vent support while family is able  to visit.  Family will update [place] with a list to people to visit today.  They do not want to leave their mother in this uncomfortable state any longer than necessary.  Family requested grief counseling.  [facility] information provided.  Encouraged them to contact [facility] even before [name] passes away as the counseling may be helpful to get thru that process.  Low dose anti-anxiety initiated to help ensure comfort and remain consistent with best practices for end of life care.  Family requested information on obtaining Medicaid for the patient.  TOC order placed. Would  appreciate assistance from financial counseling.  Family now has a code word to receive patient information  PMT will continue to follow for symptom management and family support.  Code Status:  DNR  Prognosis:   < 2 weeks   Discharge Planning:  Anticipated Hospital Death  Care plan was discussed with RN and family.  Thank you for allowing the Palliative Medicine Team to assist in the care of this patient.  Total time spent:  15 min.     Greater than 50%  of this time was spent counseling and coordinating care related to the above assessment and plan.  Tacey Ruiz, NP Palliative Medicine  Please contact Palliative MedicineTeam phone at [phone] for questions and concerns between 7 am - 7 pm.   Please see AMION for individual provider pager numbers.

## 2019-10-21 NOTE — Progress Notes (Signed)
NAME:  Kimberly Daniel, MRN:  [id number], DOB:  Jan 29, 1956, LOS: 78 ADMISSION DATE:  09/29/2019, CONSULTATION DATE:  10/07/2019 REFERRING MD:  Kathyrn Sheriff MD, CHIEF COMPLAINT:  Meningioma   Brief History   64 yo female presented to [facility] ER with headache, gait instability and confusion for 1 week.  Also had memory difficulties.  CT head showed 3 cm Rt pterional dural based mass with vasogenic edema and 14 mm midline shift.  Developed bradycardia on 5/04 and then became unresponsive.  Required intubation and PCCM consulted.  Past Medical History  Gestational DM, Allergic Rhinitis   Significant Hospital Events   4/27: Admitted  4/29: Neurosurgery delayed d/t not NPO 5/4: Acute decompensation and intubation  5/4: Decompressive R frontotemporoparietal craniectomy for resection of R frontal meningioma; placement of bone flap into subcutaneous abdominal pocket 5/8 poor neuro status.  Primary team talking to family regarding goals of care. 5/14: DNR with emphasis on comfort care.   Consults:  Neurosurgery (primary) Palliative   Procedures:  5/4: ETT emergently  5/4: Decompressive R frontotemporoparietal craniectomy for resection of R frontal meningioma; placement of bone flap into subcutaneous abdominal pocket 5/4: CVC placement, RIJ   Significant Diagnostic Tests:  4/27 Head CT Noncon:  1. Right frontal lobe severe vasogenic edema and 10 mm of leftward midline shift, effacement of the basilar cisterns and impending subfalcine herniation of the cingulate gyrus. 2. Partially calcified mass abutting the right frontal lobe is likely a meningioma. While this may be a source of the right frontal edema, there also could be an intraparenchymal lesion not visible by CT. MRI of the head with and without contrast might be helpful for further evaluation. 3. No acute hemorrhage.  4/28: Brain MRI W WO Con:  3 cm solitary, right pterional dural based mass favoring meningioma. There is contiguous  profound vasogenic edema with 14 mm of midline shift and right uncal herniation.  5/4 CT Head Noncon:  stable size of meningioma more fully characterized on prior MRI. There is persistent marked edema and mass effect, with further progression since 09/14/2019. This includes subfalcine, uncal, and cerebellar tonsillar herniation with trapping of the herniated temporal horn. There may be mild trapping of the posterior left lateral ventricle.  5/12 cth: Postop resection of right frontal meningioma. 2.5 cm area of acute hemorrhage in the resection cavity. There is extensive edema in the right frontal lobe with mass-effect. 9 mm midline shift to left improved from the preoperative study. There is downward herniation through the tentorium and foramen magnum. Effacement of the basilar cisterns with uncal herniation.  Hypodensities are present in multiple areas most likely acute infarct com involving the thalamus, pons, bilateral occipital lobe, and right frontal cortex.  Subdural hematoma along the right tentorium and floor of the middle cranial fossa on the right. Small subdural hematoma along the falx posteriorly.  Micro Data:  4/29: MRSA PCR negative  Antimicrobials:  5/4: Ancef 2g preop ppx   Interim history/subjective:  No new issues overnight Objective   Blood pressure (Abnormal) 95/53, pulse 81, temperature 99.3 F (37.4 C), resp. rate 15, height 5\' 5"  (1.651 m), weight 115.4 kg, SpO2 97 %.    Vent Mode: PRVC FiO2 (%):  [40 %] 40 % Set Rate:  [15 bmp] 15 bmp Vt Set:  [450 mL] 450 mL PEEP:  [5 cmH20] 5 cmH20 Plateau Pressure:  [12 cmH20-18 cmH20] 12 cmH20   Intake/Output Summary (Last 24 hours) at 10/21/2019 1024 Last data filed at 10/21/2019 0600 Gross  per 24 hour  Intake 300 ml  Output 700 ml  Net -400 ml   Filed Weights   09/13/2019 2117 09/14/2019 0122 10/14/19 0448  Weight: 114.3 kg 117 kg 115.4 kg    General obese 64 year old black female remains  unresponsive HEENT normocephalic atraumatic orally intubated, right craniotomy staples intact Pulmonary clear to auscultation equal chest rise on mechanically assisted breath Cardiac regular rate and rhythm Extremities warm dry Neuro unresponsive GU clear yellow  Resolved Hospital Problem list   Medically induced hypernatremia  Assessment & Plan:   Acute hypoxic respiratory failure with compromised airway. Hx of COPD. Plan Continue full ventilatory support VAP bundle  Rt meningioma with cerebral edema and brain herniation s/p resection. Plan Decadron and Keppra per neurosurgery  Goals of care. - DNR Plan Palliative care working with family.  Eventually will need to work towards one-way withdrawal  Best practice:  Diet: NPO DVT prophylaxis: SCDs GI prophylaxis: stopped per palliative care Mobility: bedrest  Code Status: DNR Disposition: ICU   Signature:  Erick Colace ACNP-BC [place] Pager # [phone] OR # [phone] if no answer

## 2019-10-21 NOTE — Progress Notes (Signed)
Palliative:  HPI:63 y.o.femalewith past medical history of COPD, diabetesadmitted on 4/27/2021with dizziness, right sided headaches, gait instability, memory changes/confusion and found to have right frontal brain mass (likely meningioma) with 62m midline shift and significant vasogenic edema.She was placed on steroids and had emergent surgery 5/4 after an acute decline requiring intubation. She has not had any further signs of improvement and neurological assessment is extremely poor. Prognosis is grim.    [facility] No changes in status. Neurological status continues to be severely grim. No family currently at bedside. Nursing asking about the care plan moving forward.   I reached out to family and left voicemail with ERandall Hissbut did not yet receive return phone call. I did speak briefly with TDorothea Oglebut he refers me to E[place]for more information of where family is in this process. I did discuss with TDorothea Oglethat I received phone call from ACaryl Pina(step sister) requesting information and TDorothea Ogleagain confirms that they do not wish [place] give her information and they will speak with her. My nurse who answers our phone knows to encourage ACaryl Pinato reach out to her stepbrothers for more information.   At this time I will continue to reach out to family and try to support them. I appreciate the patience of the medical team in recognizing that this is an extremely difficult situation for this family who already has concerns and trust issues with our health system. Unfortunately they are faced with impossible decisions and situation.   All questions/concerns addressed. Emotional support provided. Discussed with bedside RN.   Exam: Unresponsive. Decerebrate posturing. Pupils not equal size and not reactive to light. No gag reflex. Not breathing over ventilator. No distress. Extremities warm to touch.   Plan: - DNR and focus of care is comfort after family meeting last week.  -  Family have requested more time to process and visit prior to one way extubation. Day 13 endotracheal tube and recommend one way extubation soon knowing that the longer the tube in place this can cause breakdown/irritation/edema of airway tissue that could lead to more pain and discomfort that she is unable to express.   25 min  AVinie Sill NP Palliative Medicine Team Pager [phone](Please see [email or web address] for schedule) Team Phone [phone]   Greater than 50%  of this time was spent counseling and coordinating care related to the above assessment and plan

## 2019-10-21 NOTE — Progress Notes (Signed)
Palliative Medicine RN Note: Our team line has rec'd several calls today from [place], patient's step-daughter. Mrs Hamlin's sons have repeatedly asked that we not share information with her.  I repeatedly and gently directed her to call her brothers for information.  Caryl Pina asked about her father being the one who would be making decisions, as he is her husband. Upon questioning, she reports that he doesn't understand or remember the consequences of his actions; I explained that indicates he does not meet the definition of having capacity to make medical decisions, as he has to understand the consequences of decisions.  She also asked if, as his POA, she makes decisions for Mrs Laurance Flatten in his stead. I explained that he would have had to put that in writing when he was still able to make decisions; her POA only means she can make decisions for him.  She verbalized understanding and asked if "they turned off the machines yet." I again requested she call her brothers.  Notified RN on [facility] and PMT NP Elmo Putt of my call.  Marjie Skiff Makoto Sellitto, RN, BSN, [facility] Palliative Medicine Team 10/21/2019 2:21 PM Office [phone]

## 2019-10-21 NOTE — Progress Notes (Signed)
  NEUROSURGERY PROGRESS NOTE   No issues overnight.  No change neurologically. Awaiting family decision to proceed with compassionate extubation.

## 2019-10-22 ENCOUNTER — Inpatient Hospital Stay (HOSPITAL_COMMUNITY): Payer: Self-pay

## 2019-10-22 MED ORDER — ETOMIDATE 2 MG/ML IV SOLN
10.0000 mg | Freq: Once | INTRAVENOUS | Status: AC
  Administered 2019-10-22: 10 mg via INTRAVENOUS

## 2019-10-22 MED ORDER — ETOMIDATE 2 MG/ML IV SOLN
INTRAVENOUS | Status: AC
  Administered 2019-10-22: 10 mg
  Filled 2019-10-22: qty 10

## 2019-10-22 NOTE — Procedures (Signed)
Persistent cuff leak,  8-0 tube exchanged for a 7-5 tube.  Given etomidate 10mg  for comfort.   No gag or cough noted.  Changed over bougie.  7.5 placed at 22 cm at teeth.   Symmetric breath sounds.  No drop in oxygen saturation.  Tolerated procedure well.   CXR pending.

## 2019-10-22 NOTE — Progress Notes (Signed)
Assisted MD with ET tube exchange due to continuous slow leak.  Tube was exchanged out from a 8.0 to a 7.5 now at 22 teeth.  Positive color change verified and equal breath sounds present.  Patients volume remains consistent now.

## 2019-10-22 NOTE — Progress Notes (Signed)
NAME:  Kimberly Daniel, MRN:  [id number], DOB:  Jun 29, 1955, LOS: 56 ADMISSION DATE:  09/19/2019, CONSULTATION DATE:  10/22/2019 REFERRING MD:  Kathyrn Sheriff MD, CHIEF COMPLAINT:  Meningioma   Brief History   64 yo female presented to [facility] ER with headache, gait instability and confusion for 1 week.  Also had memory difficulties.  CT head showed 3 cm Rt pterional dural based mass with vasogenic edema and 14 mm midline shift.  Developed bradycardia on 5/04 and then became unresponsive.  Required intubation and PCCM consulted.  Past Medical History  Gestational DM, Allergic Rhinitis   Significant Hospital Events   4/27: Admitted  4/29: Neurosurgery delayed d/t not NPO 5/4: Acute decompensation and intubation  5/4: Decompressive R frontotemporoparietal craniectomy for resection of R frontal meningioma; placement of bone flap into subcutaneous abdominal pocket 5/8 poor neuro status.  Primary team talking to family regarding goals of care. 5/14: DNR with emphasis on comfort care.  5/17 tube changed due to cuff leak.   Consults:  Neurosurgery (primary) Palliative   Procedures:  5/4: ETT emergently  5/4: Decompressive R frontotemporoparietal craniectomy for resection of R frontal meningioma; placement of bone flap into subcutaneous abdominal pocket 5/4: CVC placement, RIJ   Significant Diagnostic Tests:  4/27 Head CT Noncon:  1. Right frontal lobe severe vasogenic edema and 10 mm of leftward midline shift, effacement of the basilar cisterns and impending subfalcine herniation of the cingulate gyrus. 2. Partially calcified mass abutting the right frontal lobe is likely a meningioma. While this may be a source of the right frontal edema, there also could be an intraparenchymal lesion not visible by CT. MRI of the head with and without contrast might be helpful for further evaluation. 3. No acute hemorrhage.  4/28: Brain MRI W WO Con:  3 cm solitary, right pterional dural based mass  favoring meningioma. There is contiguous profound vasogenic edema with 14 mm of midline shift and right uncal herniation.  5/4 CT Head Noncon:  stable size of meningioma more fully characterized on prior MRI. There is persistent marked edema and mass effect, with further progression since 10/03/2019. This includes subfalcine, uncal, and cerebellar tonsillar herniation with trapping of the herniated temporal horn. There may be mild trapping of the posterior left lateral ventricle.  5/12 cth: Postop resection of right frontal meningioma. 2.5 cm area of acute hemorrhage in the resection cavity. There is extensive edema in the right frontal lobe with mass-effect. 9 mm midline shift to left improved from the preoperative study. There is downward herniation through the tentorium and foramen magnum. Effacement of the basilar cisterns with uncal herniation.  Hypodensities are present in multiple areas most likely acute infarct com involving the thalamus, pons, bilateral occipital lobe, and right frontal cortex.  Subdural hematoma along the right tentorium and floor of the middle cranial fossa on the right. Small subdural hematoma along the falx posteriorly.  Micro Data:  4/29: MRSA PCR negative  Antimicrobials:  5/4: Ancef 2g preop ppx   Interim history/subjective:  No new issues overnight Objective   Blood pressure (Abnormal) 94/57, pulse 78, temperature 98.1 F (36.7 C), resp. rate 16, height 5\' 5"  (1.651 m), weight 115.4 kg, SpO2 93 %.    Vent Mode: PRVC FiO2 (%):  [40 %] 40 % Set Rate:  [15 bmp] 15 bmp Vt Set:  [450 mL] 450 mL PEEP:  [5 cmH20] 5 cmH20 Plateau Pressure:  [12 cmH20-21 cmH20] 20 cmH20   Intake/Output Summary (Last 24 hours) at 10/22/2019  [id number] Last data filed at 10/22/2019 0700 Gross per 24 hour  Intake no documentation  Output 750 ml  Net -750 ml   Filed Weights   09/12/2019 2117 09/09/2019 0122 10/14/19 0448  Weight: 114.3 kg 117 kg 115.4 kg   General  obese 64 year old black female remains unresponsive on ventilator HEENT normocephalic atraumatic orally intubated pupils nonreactive Pulmonary: Clear, diminished, no accessory use, no gag, no cough Cardiac: Regular rate and rhythm Abdomen: Soft nontender Extremities: Warm dry brisk cap refill. Neuro: Remains comatose  Resolved Hospital Problem list   Medically induced hypernatremia  Assessment & Plan:   Acute hypoxic respiratory failure with compromised airway. Hx of COPD. Plan Continue ventilator support VAP bundle  Rt meningioma with cerebral edema and brain herniation s/p resection. Plan Decadron and Keppra per neurosurgery  Goals of care. DNR Plan Seems as though her moving very slowly with family progress here  Best practice:  Diet: NPO DVT prophylaxis: SCDs GI prophylaxis: stopped per palliative care Mobility: bedrest  Code Status: DNR Disposition: ICU   Signature:  Erick Colace ACNP-BC [place] Pager # [phone] OR # [phone] if no answer

## 2019-10-22 NOTE — Progress Notes (Addendum)
  NEUROSURGERY PROGRESS NOTE   Pt seen and examined. ET tube changed overnight due to cuff leak, otherwise no changes.  No changes neurologically with minimal brainstem function  Ongoing family discussion for compassionate one way extubation.  Kimberly Lose, MD [facility]

## 2019-10-22 NOTE — Progress Notes (Signed)
Palliative:  HPI:63 y.o.femalewith past medical history of COPD, diabetesadmitted on 4/27/2021with dizziness, right sided headaches, gait instability, memory changes/confusion and found to have right frontal brain mass (likely meningioma) with 34mm midline shift and significant vasogenic edema.She was placed on steroids and had emergent surgery 5/4 after an acute decline requiring intubation. She has not had any further signs of improvement and neurological assessment is extremely poor. Prognosis is grim.     I came to check again with Butch Penny. Only change to exam today is that she has no response to deep sternal rub (only response previously is decerebrate posturing) and is noted to NOT breathe over vent. RN confirms she has not been breathing over ventilator. I will continue to try and reach out to family to continue support. I also discussed with bedside RN Manuela Schwartz that may need to consider reassessment for complete brain death given changes in assessment.   I reached out to all 3 sons today by phone - Randall Hiss and Legrand Como have full voicemail and unable to leave message. I did leave Dorothea Ogle a voicemail requesting return call. I will attempt to text message them from palliative cell phone tomorrow if no call back.   I am unable to support and advocate for family or develop a care plan for Joletta if her family will not communicate with me. I will continue my efforts.   Exam: Unresponsive. No response to deep sternal rub. Pupils not equal and not reactive to light. No gag reflex. Not breathing over ventilator. Warm to touch.   Plan: - I will continue attempts to discuss care plan with family. She is at limit for ETT support. Family were offered a week for visitation prior to extubation which will be Thursday so will need to plan next steps with family tomorrow (Wednesday).  - Discussed plan with Manuela Schwartz RN, Marni Griffon NP, Alyce Pagan.   25 min  Vinie Sill, NP Palliative Medicine Team Pager  [phone] (Please see [email or web address] for schedule) Team Phone [phone]    Greater than 50%  of this time was spent counseling and coordinating care related to the above assessment and plan

## 2019-11-05 NOTE — Death Summary Note (Signed)
DEATH SUMMARY   Patient Details  Name: Kimberly Daniel MRN: [id number] DOB: [birth year]  Admission/Discharge Information   Admit Date:  2019-10-02  Date of Death: Date of Death: 24-Oct-2019  Time of Death: Time of Death: Aug 29, 1106  Length of Stay: 08/26/2022  Referring Physician: Kristen Loader, FNP   Reason(s) for Hospitalization  meningioma.  Diagnoses  Preliminary cause of death: cerebral herniation. Secondary Diagnoses (including complications and co-morbidities):  Active Problems:   Benign neoplasm of brain (HCC)   Acute hypoxemic respiratory failure (HCC)   Neurogenic shock (HCC)   Intracranial pressure increased   Goals of care, counseling/discussion   Palliative care by specialist   Pressure injury of skin   Brain herniation [facility])   Palliative care encounter   [place] [facility] Course (including significant findings, care, treatment, and services provided and events leading to death)  Kimberly Daniel is a 64 y.o. year old female who presented with headache, gait instability and confusion for 1 week.  Also had memory difficulties.  CT head showed 3 cm Rt pterional dural based mass with vasogenic edema and 14 mm midline shift.  Surgery delayed because patient had eaten. Developed bradycardia on 5/04 and then became unresponsive. Decompressive craniectomy but nor improvement in mental status.  Palliative care involved and patient made DNR. Plan of care was progressing towards compassionate extubation but patient became more hypotensive coinciding with declining neurological exam suggestive of progressive edema. No intervention given poor neurological prognosis and family notified.  Eventually patient passed away at 1108    Pertinent Labs and Studies  Significant Diagnostic Studies DG Abd 1 View  Result Date: 10/12/2019 CLINICAL DATA:  Orogastric tube placement EXAM: ABDOMEN - 1 VIEW COMPARISON:  Oct 10, 2019 FINDINGS: Orogastric tube tip and side port are in the stomach. There  is no appreciable bowel dilatation or air-fluid level in the visualized bowel region. No free air. Lung bases clear. IMPRESSION: Orogastric tube tip and side port in stomach. Visualized bowel unremarkable. No free air. Electronically Signed   By: Lowella Grip III M.D.   On: 10/12/2019 12:13   CT HEAD WO CONTRAST  Result Date: 10/16/2019 CLINICAL DATA:  Craniotomy for meningioma resection 10/06/2019. EXAM: CT HEAD WITHOUT CONTRAST TECHNIQUE: Contiguous axial images were obtained from the base of the skull through the vertex without intravenous contrast. COMPARISON:  CT head 10/22/2019 and MRI 10/02/2019 FINDINGS: Brain: Postop resection of right lateral frontal meningioma. Acute blood products are seen at the tumor site measuring approximately 2.5 cm in diameter. There is extensive cerebral edema in the right frontal lobe extending into the basal ganglia. There is extensive edema in the white matter on the preoperative studies. There is extensive mass-effect with 9 mm midline shift to the left which has improved from the preoperative studies. There is swelling of the brain through the craniotomy defect. There is transtentorial herniation due to mass-effect. Cerebellar tonsils project into the cervical canal which has progressed. Hypodensity in the thalamus bilaterally may represent acute infarct. Hypodensity in the inferior occipital lobe bilaterally likely represents acute infarct. Hypodensity in the central pons also concerning for infarct. Hypodensity in the right posterior frontal cortex compatible with acute infarct. Mild subdural hematoma along the right tentorium and along the posterior falx. Subdural hematoma also present in the floor of the right middle cranial fossa. Vascular: Negative for hyperdense vessel Skull: Right frontal craniectomy.  Craniotomy flap not replace. Sinuses/Orbits: Mild mucosal edema paranasal sinuses. Negative orbit. Other: None IMPRESSION: Postop resection of  right frontal  meningioma. 2.5 cm area of acute hemorrhage in the resection cavity. There is extensive edema in the right frontal lobe with mass-effect. 9 mm midline shift to left improved from the preoperative study. There is downward herniation through the tentorium and foramen magnum. Effacement of the basilar cisterns with uncal herniation. Hypodensities are present in multiple areas most likely acute infarct com involving the thalamus, pons, bilateral occipital lobe, and right frontal cortex. Subdural hematoma along the right tentorium and floor of the middle cranial fossa on the right. Small subdural hematoma along the falx posteriorly. These results were called by telephone at the time of interpretation on 10/16/2019 at 1:46 pm to provider Kathyrn Sheriff, who verbally acknowledged these results. Electronically Signed   By: Franchot Gallo M.D.   On: 10/16/2019 13:47   CT HEAD WO CONTRAST  Result Date: 10/29/2019 CLINICAL DATA:  Meningioma, follow-up EXAM: CT HEAD WITHOUT CONTRAST TECHNIQUE: Contiguous axial images were obtained from the base of the skull through the vertex without intravenous contrast. COMPARISON:  09/23/2019 FINDINGS: Brain: Stable size of partially calcified extra-axial lesion along the right frontal convexity. There is stable marked underlying parenchymal edema resulting in regional mass effect, subfalcine herniation with leftward midline shift measuring 11 mm, central herniation with effacement of the basal cisterns, cerebellar tonsillar herniation and right uncal herniation. Trapping of the herniated right temporal horn. There may be mild trapping of the posterior left lateral ventricle with adjacent interstitial edema. No acute intracranial hemorrhage. No new loss of gray-white differentiation. Vascular: No new findings. Skull: Unremarkable. Sinuses/Orbits: No acute abnormality. Other: Mastoid air cells are clear. IMPRESSION: Stable size of meningioma more fully characterized on prior MRI. There is  persistent marked edema and mass effect, with further progression since 09/18/2019. This includes subfalcine, uncal, and cerebellar tonsillar herniation with trapping of the herniated temporal horn. There may be mild trapping of the posterior left lateral ventricle. Neurosurgery aware and plan for surgery at time of dictation. Electronically Signed   By: Macy Mis M.D.   On: 10/12/2019 08:58   CT HEAD WO CONTRAST  Result Date: 09/10/2019 CLINICAL DATA:  Dizziness and headaches EXAM: CT HEAD WITHOUT CONTRAST TECHNIQUE: Contiguous axial images were obtained from the base of the skull through the vertex without intravenous contrast. COMPARISON:  None. FINDINGS: Brain: There is a partially calcified mass along the right frontal convexity that measures 2.1 x 2.7 cm. There is severe vasogenic edema within the right frontal lobe causing 10 mm of leftward midline shift at the level of the foramina of [place]. No acute hemorrhage. Basal cisterns are effaced. There is impending subfalcine herniation of the cingulate gyrus. Vascular: No abnormal hyperdensity of the major intracranial arteries or dural venous sinuses. No intracranial atherosclerosis. Skull: The visualized skull base, calvarium and extracranial soft tissues are normal. Sinuses/Orbits: No fluid levels or advanced mucosal thickening of the visualized paranasal sinuses. No mastoid or middle ear effusion. The orbits are normal. IMPRESSION: 1. Right frontal lobe severe vasogenic edema and 10 mm of leftward midline shift, effacement of the basilar cisterns and impending subfalcine herniation of the cingulate gyrus. 2. Partially calcified mass abutting the right frontal lobe is likely a meningioma. While this may be a source of the right frontal edema, there also could be an intraparenchymal lesion not visible by CT. MRI of the head with and without contrast might be helpful for further evaluation. 3. No acute hemorrhage. Electronically Signed   By: Ulyses Jarred M.D.   On: 09/24/2019 23:06  MR BRAIN W WO CONTRAST  Result Date: 10/02/2019 CLINICAL DATA:  Brain mass on head CT EXAM: MRI HEAD WITHOUT AND WITH CONTRAST TECHNIQUE: Multiplanar, multiecho pulse sequences of the brain and surrounding structures were obtained without and with intravenous contrast. CONTRAST:  73mL GADAVIST GADOBUTROL 1 MMOL/ML IV SOLN COMPARISON:  Head CT from yesterday FINDINGS: Brain: Solitary dural-based right pterional mass measuring up to 3 x 3 x 2.3 cm, with dural tail. There is mass effect on the adjacent lateral frontal lobe with profound vasogenic edema throughout the much of the right cerebral white matter. Midline shift measures up to 14 mm and there is right uncal herniation No acute infarct, acute hemorrhage, or hydrocephalus Vascular: Present flow voids and vascular enhancements Skull and upper cervical spine: Negative Sinuses/Orbits: Negative IMPRESSION: 3 cm solitary, right pterional dural based mass favoring meningioma. There is contiguous profound vasogenic edema with 14 mm of midline shift and right uncal herniation. Electronically Signed   By: Monte Fantasia M.D.   On: 10/02/2019 07:50   DG CHEST PORT 1 VIEW  Result Date: 10/22/2019 CLINICAL DATA:  Intubation EXAM: PORTABLE CHEST 1 VIEW COMPARISON:  10/17/2019 FINDINGS: Endotracheal tube tip is about 3.5 cm superior to the carina. Esophageal tube tip is below the diaphragm but incompletely imaged. Asymmetric hazy opacity of the right thorax some of which may be due to overlying soft tissues. New dense airspace consolidations at both bases. Enlarged cardiomediastinal silhouette with aortic atherosclerosis. No pneumothorax. IMPRESSION: 1. Endotracheal tube tip about 3.5 cm superior to carina 2. New dense bilateral lung base consolidations, atelectasis versus pneumonia versus aspiration 3. Hazy asymmetric opacity of the right thorax some of which may be attributable to overlying soft tissue Electronically Signed    By: Donavan Foil M.D.   On: 10/22/2019 00:59   DG CHEST PORT 1 VIEW  Result Date: 10/17/2019 CLINICAL DATA:  Respiratory failure EXAM: PORTABLE CHEST 1 VIEW COMPARISON:  10/07/2019 FINDINGS: Endotracheal tube is approximately 5 cm above the carina. Enteric tube passes below the diaphragm with tip out of field of view. Right central line is no longer present Improved aeration at the left lung base. No significant pleural effusion. No pneumothorax. Stable cardiomediastinal contours. IMPRESSION: Lines and tubes as above.  Improved aeration of the left lung base. Electronically Signed   By: Macy Mis M.D.   On: 10/17/2019 14:52   DG CHEST PORT 1 VIEW  Result Date: 10/11/2019 CLINICAL DATA:  Hypoxia EXAM: PORTABLE CHEST 1 VIEW COMPARISON:  March 25, 2020 FINDINGS: Endotracheal tube tip is at the carina. Nasogastric tube tip and side port are below the diaphragm with side port seen in the body of the stomach region. Central catheter tip is in the superior vena cava. No pneumothorax. There is atelectatic change in the right upper lobe. There is consolidation in the left lower lobe with small left pleural effusion. Heart size and pulmonary vascularity are normal. No adenopathy. There is aortic atherosclerosis. No bone lesions. IMPRESSION: 1. Tube and catheter positions as described without pneumothorax. Note that the endotracheal tube tip is at the carina. Advise withdrawing endotracheal tube approximately 2.5-3 cm. 2. Consolidation left lower lobe with small left pleural effusion. Concern for potential pneumonia or aspiration in this area. 3.  Right upper lobe atelectatic change. 4.  Stable cardiac silhouette. These results will be called to the ordering clinician or representative by the Radiologist Assistant, and communication documented in the PACS or [employer]. Electronically Signed   By: Lowella Grip III  M.D.   On: 10/29/2019 11:54   DG Abd Portable 1V  Result Date: 10/10/2019 CLINICAL  DATA:  OG tube placement EXAM: PORTABLE ABDOMEN - 1 VIEW COMPARISON:  None. FINDINGS: There appear to be 2 esophageal catheters, 1 tip projects over the GE junction, the other projects over the mid gastric region. IMPRESSION: Apparent 2 esophageal catheters, 1 tip overlies the GE junction. The other tip overlies the mid stomach. Electronically Signed   By: Donavan Foil M.D.   On: 10/10/2019 23:20    Microbiology No results found for this or any previous visit (from the past 240 hour(s)).  Lab Basic Metabolic Panel: Recent Labs  Lab 10/17/19 0708  NA 143  K 4.6  CL 103  CO2 29  GLUCOSE 215*  BUN 31*  CREATININE 0.77  CALCIUM 9.4  MG 1.9   Liver Function Tests: No results for input(s): AST, ALT, ALKPHOS, BILITOT, PROT, ALBUMIN in the last 168 hours. No results for input(s): LIPASE, AMYLASE in the last 168 hours. No results for input(s): AMMONIA in the last 168 hours. CBC: Recent Labs  Lab 10/17/19 0708  WBC 17.9*  HGB 14.5  HCT 46.3*  MCV 91.3  PLT 195   Cardiac Enzymes: No results for input(s): CKTOTAL, CKMB, CKMBINDEX, TROPONINI in the last 168 hours. Sepsis Labs: Recent Labs  Lab 10/17/19 0708  PROCALCITON <0.10  WBC 17.9*    Procedures/Operations  Mechanical ventilation Decompressive craniectomy.    Amarah Brossman 27-Oct-2019, 1:19 PM

## 2019-11-05 NOTE — Progress Notes (Signed)
Spoke with Kimberly Daniel, [place]. Pt is not an ME case, okay to remove lines and tubes and drains.

## 2019-11-05 NOTE — Progress Notes (Signed)
Palliative:  HPI:63 y.o.femalewith past medical history of COPD, diabetesadmitted on 4/27/2021with dizziness, right sided headaches, gait instability, memory changes/confusion and found to have right frontal brain mass (likely meningioma) with 45mm midline shift and significant vasogenic edema.She was placed on steroids and had emergent surgery 5/4 after an acute decline requiring intubation. She has not had any further signs of improvement and neurological assessment is extremely poor. Prognosis is grim.    Kimberly Daniel is acutely declining towards end of life. I have attempted to call all 3 sons multiple times. I did leave voicemail and sent text message to both Randall Hiss and Dorothea Ogle relaying that there are acute changes and I am concerned and requesting they call back or come to the hospital if they wish to be here. No response thus far. They were told in family meeting that there is a possibility that she may die on ventilator support.   At this time will continue current care but not escalate care as prognosis grim and this will only add to suffering at end of life - this is consistent with previous stated goals to continue ventilator support but to optimize comfort as well. She is having tachypnea and respiratory distress and discussed giving fentanyl for comfort as family have stated they do not want her to suffer.   I did have concern that she may have progressed to complete brain death per my assessment yesterday but today she is tachypneic over ventilator. Today she appears to be progressing at end of life and actively dying. Unfortunate situation and I continue attempts to communicate these changes to family.   Exam: Unresponsive. No response to deep sternal rub. Pupils not equal and not reactive to light. No gag reflex.Tachypnea and distress on ventilator. Warm to touch.   Plan: - Actively dying.  - PRN medication available for comfort and to minimize suffering at end of life.   [place], NP Palliative Medicine Team Pager [phone] (Please see [email or web address] for schedule) Team Phone [phone]    Greater than 50%  of this time was spent counseling and coordinating care related to the above assessment and plan

## 2019-11-05 NOTE — Progress Notes (Signed)
Palliative:  I received notification that Jakaiya has died while trying to reach family. I was able to reach Randall Hiss and have notified him that his mother has died this morning. He verbalizes understanding and tells me that he is at peace. He will notify the rest of the family and reach back out to [place]. I offered my condolences and will continue to keep them in my thoughts and prayers. Encouraged him to reach out if there is anything else I can do to help.   Vinie Sill, NP Palliative Medicine Team Pager [phone] (Please see [email or web address] for schedule) Team Phone [phone]

## 2019-11-05 NOTE — Progress Notes (Signed)
NAME:  Kimberly Daniel, MRN:  [id number], DOB:  17-Dec-1955, LOS: 21 ADMISSION DATE:  09/21/2019, CONSULTATION DATE:  10/30/2019 REFERRING MD:  Kathyrn Sheriff MD, CHIEF COMPLAINT:  Meningioma   Brief History   64 yo female presented to [facility] ER with headache, gait instability and confusion for 1 week.  Also had memory difficulties.  CT head showed 3 cm Rt pterional dural based mass with vasogenic edema and 14 mm midline shift.  Developed bradycardia on 5/04 and then became unresponsive.  Required intubation and PCCM consulted.  Past Medical History  Gestational DM, Allergic Rhinitis   Significant Hospital Events   4/27: Admitted  4/29: Neurosurgery delayed d/t not NPO 5/4: Acute decompensation and intubation  5/4: Decompressive R frontotemporoparietal craniectomy for resection of R frontal meningioma; placement of bone flap into subcutaneous abdominal pocket 5/8 poor neuro status.  Primary team talking to family regarding goals of care. 5/14: DNR with emphasis on comfort care.  5/17 tube changed due to cuff leak.   Consults:  Neurosurgery (primary) Palliative   Procedures:  5/4: ETT emergently  5/4: Decompressive R frontotemporoparietal craniectomy for resection of R frontal meningioma; placement of bone flap into subcutaneous abdominal pocket 5/4: CVC placement, RIJ   Significant Diagnostic Tests:  4/27 Head CT Noncon:  1. Right frontal lobe severe vasogenic edema and 10 mm of leftward midline shift, effacement of the basilar cisterns and impending subfalcine herniation of the cingulate gyrus. 2. Partially calcified mass abutting the right frontal lobe is likely a meningioma. While this may be a source of the right frontal edema, there also could be an intraparenchymal lesion not visible by CT. MRI of the head with and without contrast might be helpful for further evaluation. 3. No acute hemorrhage.  4/28: Brain MRI W WO Con:  3 cm solitary, right pterional dural based mass  favoring meningioma. There is contiguous profound vasogenic edema with 14 mm of midline shift and right uncal herniation.  5/4 CT Head Noncon:  stable size of meningioma more fully characterized on prior MRI. There is persistent marked edema and mass effect, with further progression since 09/05/2019. This includes subfalcine, uncal, and cerebellar tonsillar herniation with trapping of the herniated temporal horn. There may be mild trapping of the posterior left lateral ventricle.  5/12 cth: Postop resection of right frontal meningioma. 2.5 cm area of acute hemorrhage in the resection cavity. There is extensive edema in the right frontal lobe with mass-effect. 9 mm midline shift to left improved from the preoperative study. There is downward herniation through the tentorium and foramen magnum. Effacement of the basilar cisterns with uncal herniation.  Hypodensities are present in multiple areas most likely acute infarct com involving the thalamus, pons, bilateral occipital lobe, and right frontal cortex.  Subdural hematoma along the right tentorium and floor of the middle cranial fossa on the right. Small subdural hematoma along the falx posteriorly.  Micro Data:  4/29: MRSA PCR negative  Antimicrobials:  5/4: Ancef 2g preop ppx   Interim history/subjective:  No new issues overnight. Continued discussion with family regarding goals of care. Objective   Blood pressure (!) 82/55, pulse (!) 137, temperature (!) 103.3 F (39.6 C), temperature source Axillary, resp. rate (!) 31, height 5\' 5"  (1.651 m), weight 115.4 kg, SpO2 94 %.    Vent Mode: PRVC FiO2 (%):  [40 %] 40 % Set Rate:  [15 bmp] 15 bmp Vt Set:  [450 mL] 450 mL PEEP:  [5 cmH20] 5 cmH20 Plateau Pressure:  [  19 cmH20-21 cmH20] 20 cmH20   Intake/Output Summary (Last 24 hours) at 26-Oct-2019 1315 Last data filed at 10/22/2019 1748 Gross per 24 hour  Intake --  Output 165 ml  Net -165 ml   Filed Weights   09/09/2019  2117 09/07/2019 0122 10/14/19 0448  Weight: 114.3 kg 117 kg 115.4 kg   General obese 64 year old black female remains unresponsive on ventilator HEENT normocephalic atraumatic orally intubated pupils nonreactive, right pupil dilated. Pulmonary: Clear, diminished, no accessory use, no gag, no cough. Spontaneously overbreathing ventilator Cardiac: Regular rate and rhythm Abdomen: Soft nontender Extremities: Warm dry brisk cap refill. Neuro: Remains comatose  Resolved Hospital Problem list   Medically induced hypernatremia  Assessment & Plan:   Critically ill due acute hypoxic respiratory failure with compromised airway. Requiring mechanical ventilation.  Hx of COPD. Plan Continue ventilator support VAP bundle  Critically ill due to hypotension. Likely secondary to cerebral herniation.  No further intervention as patient's prognosis is dismal   Rt meningioma with cerebral edema and brain herniation s/p resection. Plan Decadron and Keppra per neurosurgery  Goals of care. DNR Plan Seems as though her moving very slowly with family progress here.  Son, ERIC notified of patient's decline. Eventually patient passed away at 12:11.  Best practice:  Diet: NPO DVT prophylaxis: SCDs GI prophylaxis: stopped per palliative care Mobility: bedrest  Code Status: DNR Disposition: ICU   CRITICAL CARE Performed by: Kipp Brood   Total critical care time: 30 minutes  Critical care time was exclusive of separately billable procedures and treating other patients.  Critical care was necessary to treat or prevent imminent or life-threatening deterioration.  Critical care was time spent personally by me on the following activities: development of treatment plan with patient and/or surrogate as well as nursing, discussions with consultants, evaluation of patient's response to treatment, examination of patient, obtaining history from patient or surrogate, ordering and performing treatments  and interventions, ordering and review of laboratory studies, ordering and review of radiographic studies, pulse oximetry, re-evaluation of patient's condition and participation in multidisciplinary rounds.  Kipp Brood, MD [facility] ICU Physician [place]  Pager: [phone] Mobile: [phone] After hours: [phone].

## 2019-11-05 NOTE — Progress Notes (Signed)
  NEUROSURGERY PROGRESS NOTE   No issues overnight.   EXAM:  BP (!) 82/55 Comment: Dr. Lynetta Mare aware  Pulse (!) 137   Temp (!) 102.4 F (39.1 C) (Axillary)   Resp (!) 31   Ht 5\' 5"  (1.651 m)   Wt 115.4 kg   SpO2 94%   BMI 42.34 kg/m   No eye opening to pain R pupil 34mm non-reactive, left 61mm non-reactive (-) cough/gag (+) initiating some breaths over vent No motor response to pain  IMPRESSION:  64 y.o. 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 There has been no improvement in her condition over the last 2 and a half weeks and while she does not appear to meet criteria for brain death, I continue to believe there is no chance for meaningful recovery.  PLAN: - Neither myself nor palliative care team have been able to reach pts POA by phone (son Dorothea Ogle). At family meeting last Thurs 5/13 we changed code status to DNR and agreed to one week of continued supportive care to allow family to visit prior to one-way extubation.

## 2019-11-05 DEATH — deceased

## 2021-01-24 IMAGING — CT CT HEAD W/O CM
4 series · 15 of 47 positions shown, 17 images · non-contrast
Comparison: CT head 10/08/2019 and MRI 10/02/2019

CLINICAL DATA: Craniotomy for meningioma resection 10/08/2019.

EXAM:
CT HEAD WITHOUT CONTRAST
TECHNIQUE: Contiguous axial images were obtained from the base of the skull
through the vertex without intravenous contrast.

[Series 3: head without · axial · non-contrast · 0.42mm/px · z∈[+413,+538]mm · 7 of 35 slices shown, 9 images]
[im 5/35  brain]
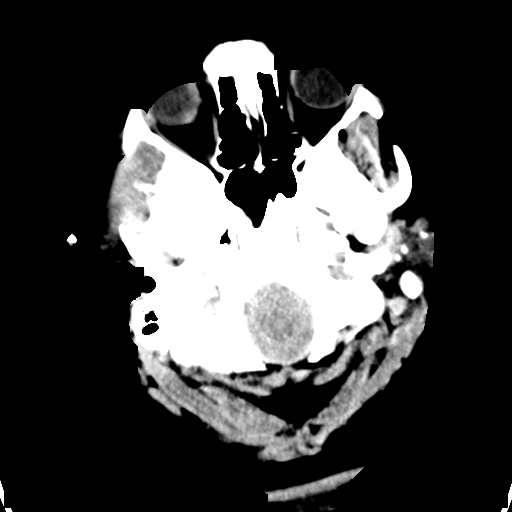
[im 5/35  bone]
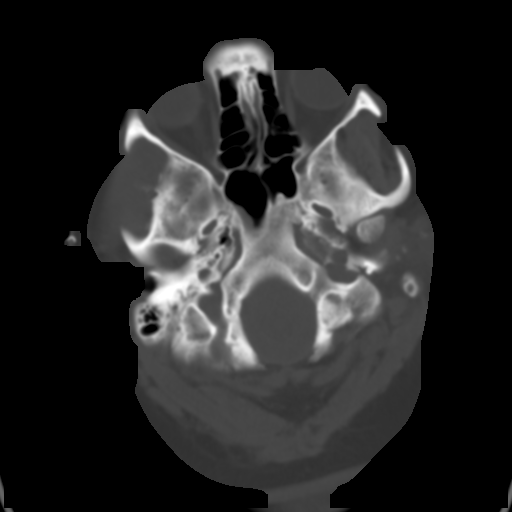
[im 9/35  brain]
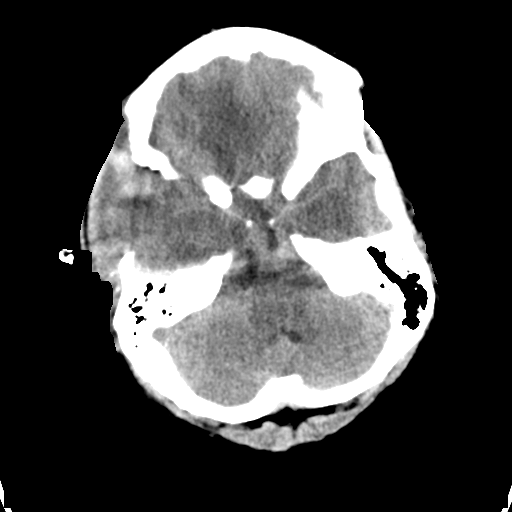
[im 13/35  brain]
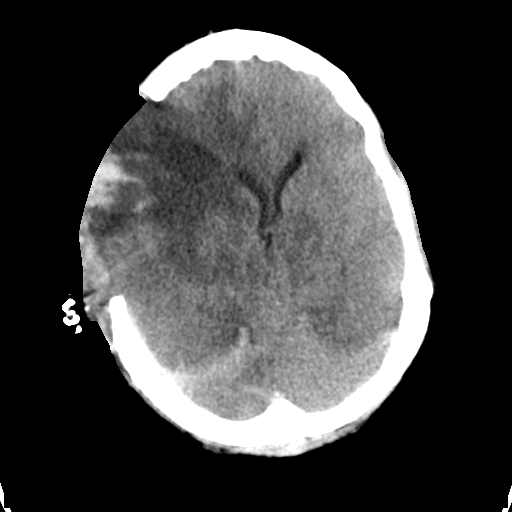
[im 18/35  brain]
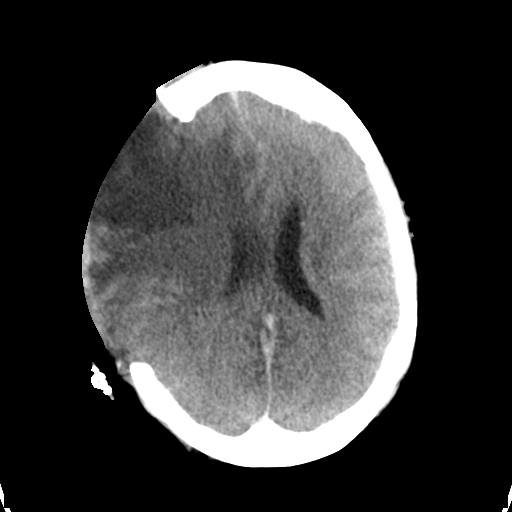
[im 22/35  brain]
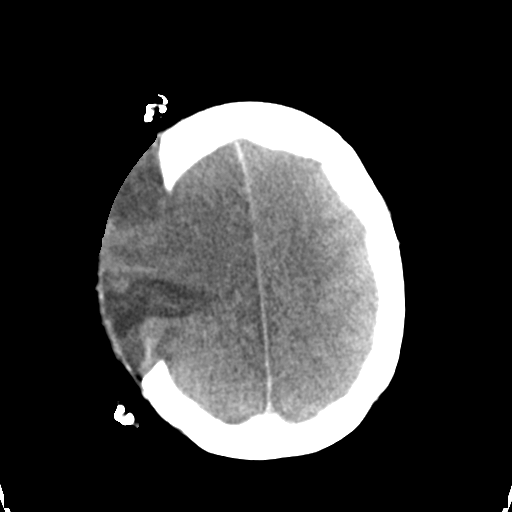
[im 22/35  bone]
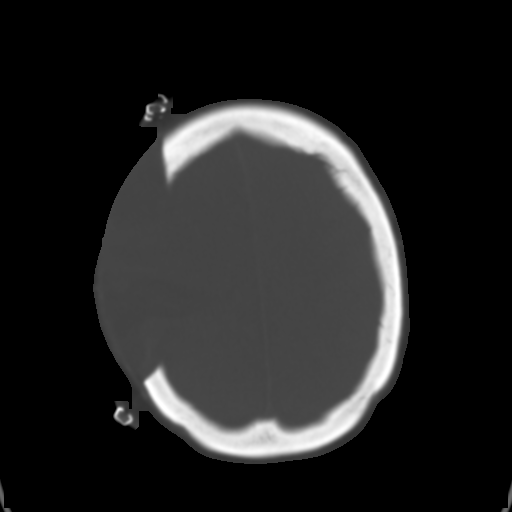
[im 26/35  brain]
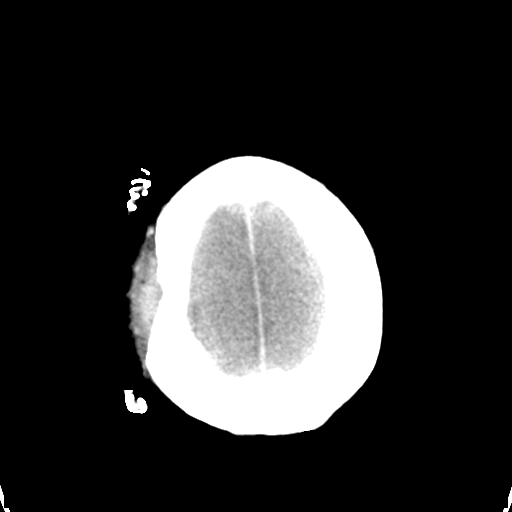
[im 30/35  brain]
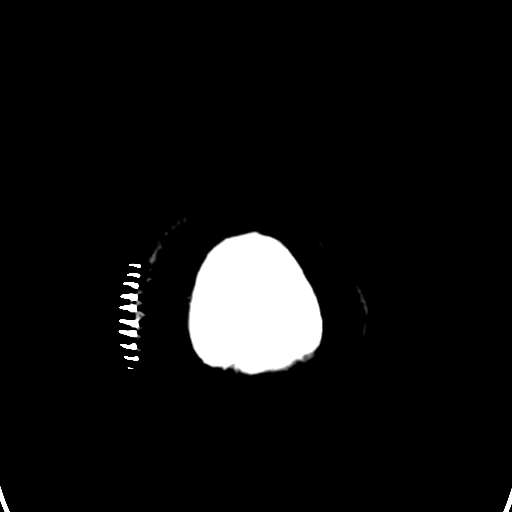

[Series 4: head bone · axial · 0.42mm/px · z∈[+409,+427]mm · 2 of 87 slices shown]
[im 9/87  bone]
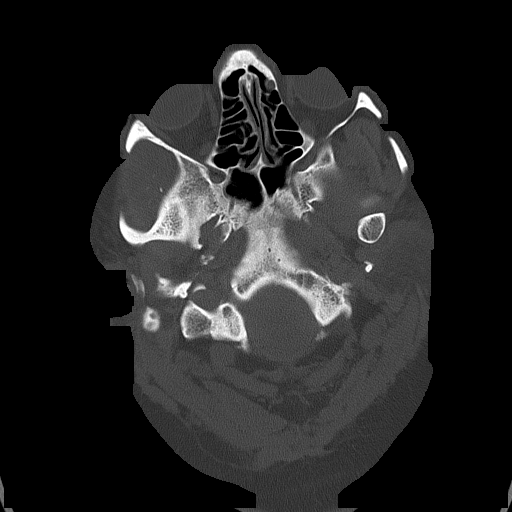
[im 18/87  bone]
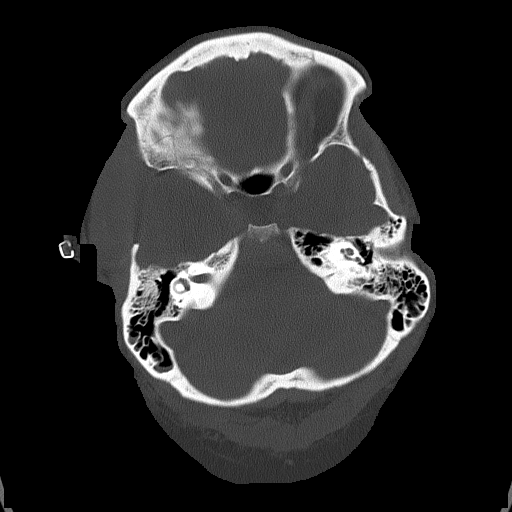

[Series 5: head without cor · coronal · non-contrast · 0.31mm/px · 3 of 67 slices shown]
[im 23/67  brain]
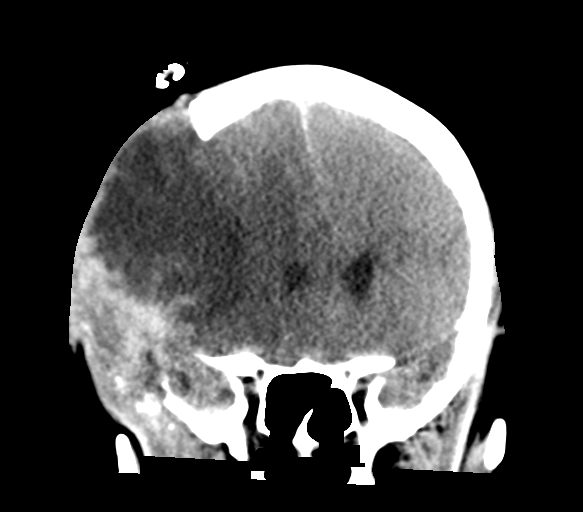
[im 30/67  brain]
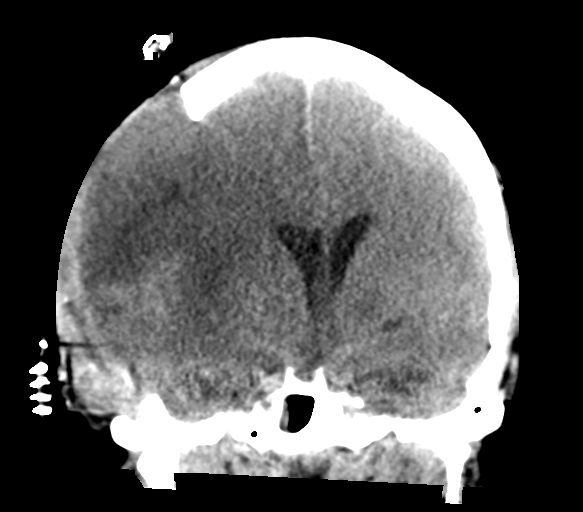
[im 37/67  brain]
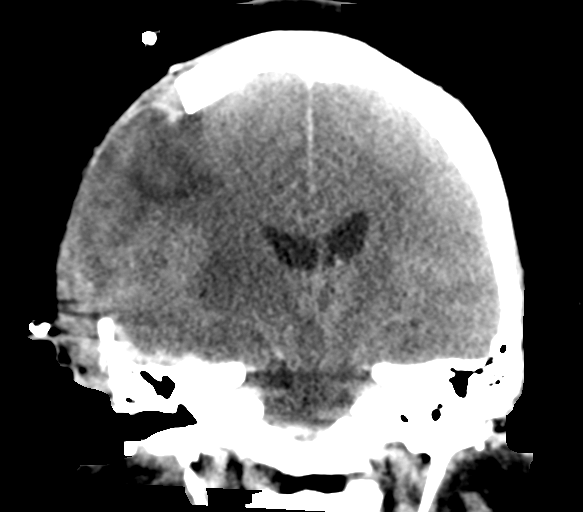

[Series 6: head without sag · sagittal · non-contrast · 0.34mm/px · 3 of 58 slices shown]
[im 20/58  brain]
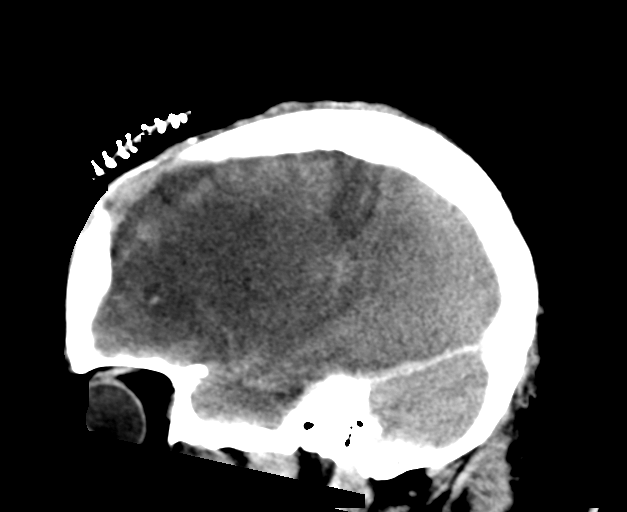
[im 29/58  brain]
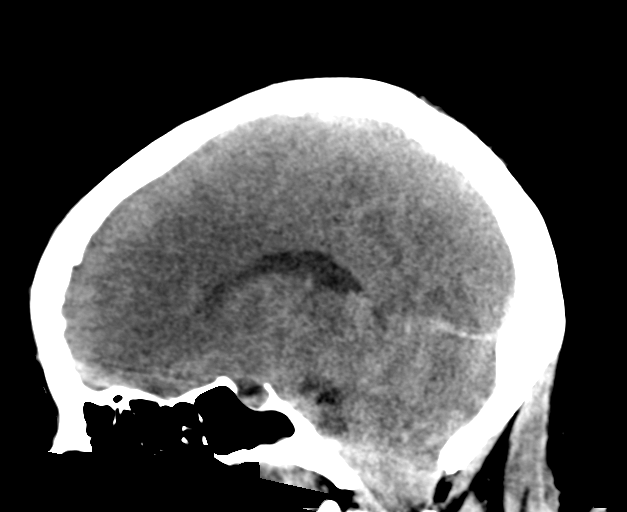
[im 39/58  brain]
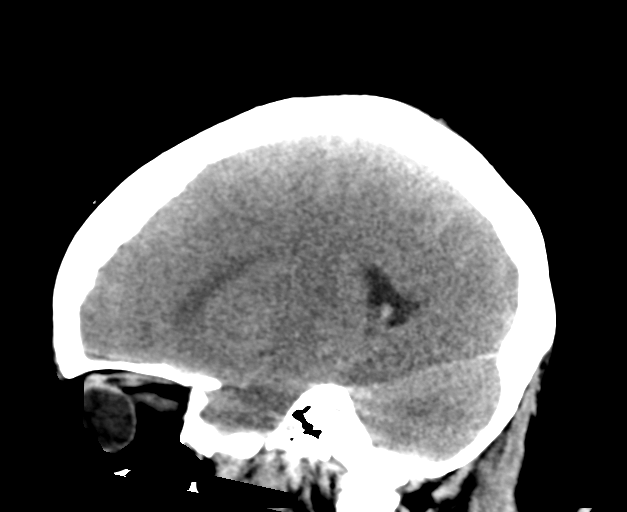

[15 of 47 positions shown; findings below may reference images not displayed]

FINDINGS: Brain: Postop resection of right lateral frontal meningioma. Acute
blood products are seen at the tumor site measuring approximately
2.5 cm in diameter. There is extensive cerebral edema in the right
frontal lobe extending into the basal ganglia. There is extensive
edema in the white matter on the preoperative studies. There is
extensive mass-effect with 9 mm midline shift to the left which has
improved from the preoperative studies. There is swelling of the
brain through the craniotomy defect. There is transtentorial
herniation due to mass-effect. Cerebellar tonsils project into the
cervical canal which has progressed.

Hypodensity in the thalamus bilaterally may represent acute infarct.
Hypodensity in the inferior occipital lobe bilaterally likely
represents acute infarct. Hypodensity in the central pons also
concerning for infarct. Hypodensity in the right posterior frontal
cortex compatible with acute infarct.

Mild subdural hematoma along the right tentorium and along the
posterior falx. Subdural hematoma also present in the floor of the
right middle cranial fossa.

Vascular: Negative for hyperdense vessel

Skull: Right frontal craniectomy.  Craniotomy flap not replace.

Sinuses/Orbits: Mild mucosal edema paranasal sinuses. Negative
orbit.

Other: None
IMPRESSION: Postop resection of right frontal meningioma. 2.5 cm area of acute
hemorrhage in the resection cavity. There is extensive edema in the
right frontal lobe with mass-effect. 9 mm midline shift to left
improved from the preoperative study. There is downward herniation
through the tentorium and foramen magnum. Effacement of the basilar
cisterns with uncal herniation.

Hypodensities are present in multiple areas most likely acute
infarct com involving the thalamus, pons, bilateral occipital lobe,
and right frontal cortex.

Subdural hematoma along the right tentorium and floor of the middle
cranial fossa on the right. Small subdural hematoma along the falx
posteriorly.

These results were called by telephone at the time of interpretation
on 10/16/2019 at [DATE] to provider Frenkel, who verbally
acknowledged these results.

## 2021-01-25 IMAGING — DX DG CHEST 1V PORT
1 series · 1 of 1 positions shown · non-contrast
Comparison: 10/08/2019

CLINICAL DATA: Respiratory failure

EXAM:
PORTABLE CHEST 1 VIEW

[chest ap]
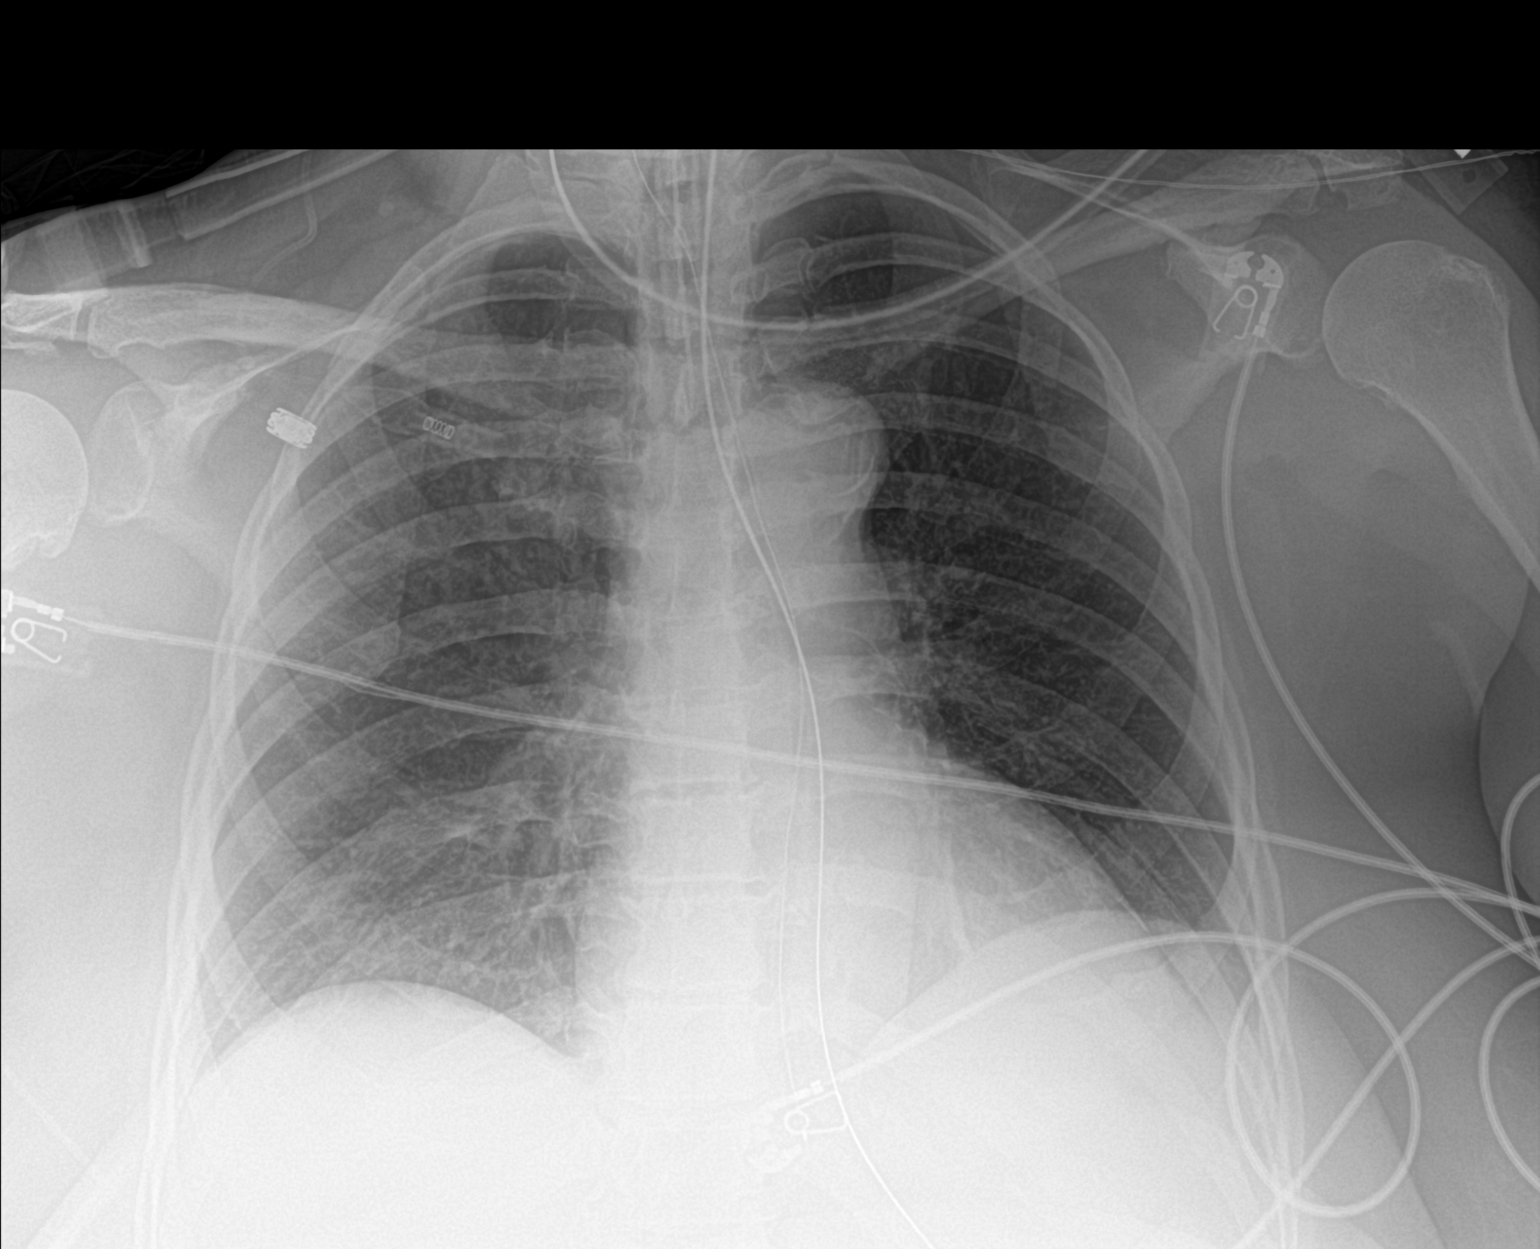

[1 of 1 positions shown; findings below may reference images not displayed]

FINDINGS: Endotracheal tube is approximately 5 cm above the carina. Enteric
tube passes below the diaphragm with tip out of field of view. Right
central line is no longer present

Improved aeration at the left lung base. No significant pleural
effusion. No pneumothorax. Stable cardiomediastinal contours.
IMPRESSION: Lines and tubes as above.  Improved aeration of the left lung base.
